# Patient Record
Sex: Female | Born: 1955 | Race: White | Hispanic: No | Marital: Married | Smoking: Never smoker
Health system: Southern US, Community
[De-identification: ages and names within clinical notes are randomized; demographics above are authoritative.]

## PROBLEM LIST (undated history)

## (undated) DIAGNOSIS — R112 Nausea with vomiting, unspecified: Secondary | ICD-10-CM

## (undated) DIAGNOSIS — E042 Nontoxic multinodular goiter: Secondary | ICD-10-CM

## (undated) DIAGNOSIS — R42 Dizziness and giddiness: Secondary | ICD-10-CM

## (undated) DIAGNOSIS — T8859XA Other complications of anesthesia, initial encounter: Secondary | ICD-10-CM

## (undated) DIAGNOSIS — C801 Malignant (primary) neoplasm, unspecified: Secondary | ICD-10-CM

## (undated) DIAGNOSIS — Z8739 Personal history of other diseases of the musculoskeletal system and connective tissue: Secondary | ICD-10-CM

## (undated) DIAGNOSIS — F419 Anxiety disorder, unspecified: Secondary | ICD-10-CM

## (undated) DIAGNOSIS — Z9889 Other specified postprocedural states: Secondary | ICD-10-CM

## (undated) DIAGNOSIS — R232 Flushing: Secondary | ICD-10-CM

## (undated) DIAGNOSIS — M1990 Unspecified osteoarthritis, unspecified site: Secondary | ICD-10-CM

## (undated) DIAGNOSIS — R0683 Snoring: Secondary | ICD-10-CM

## (undated) HISTORY — DX: Personal history of other diseases of the musculoskeletal system and connective tissue: Z87.39

## (undated) HISTORY — PX: OTHER SURGICAL HISTORY: SHX169

## (undated) HISTORY — DX: Flushing: R23.2

## (undated) HISTORY — DX: Anxiety disorder, unspecified: F41.9

## (undated) HISTORY — PX: PLACEMENT OF BREAST IMPLANTS: SHX6334

## (undated) HISTORY — DX: Unspecified osteoarthritis, unspecified site: M19.90

## (undated) HISTORY — DX: Dizziness and giddiness: R42

## (undated) HISTORY — PX: LUMBAR MICRODISCECTOMY: SHX99

## (undated) NOTE — Progress Notes (Signed)
 Formatting of this note is different from the original. Images from the original note were not included.  [facility] PCP: Dr. Geralene Annual exam Date: 08/21/2022  CHIEF COMPLAINT Chief Complaint  Patient presents with  ? Comprehensive Exam    CE part 2   SUBJECTIVE The patient is a 49 y.o. female presents with a history significant for history breast cancer presenting for annual exam.  The visit will be addressed in the following problem based manner:  Concerns:   - itchiness crease in her right leg; neosporin and facial ointments   -- 2 weeks of lotrimin and improved   -- would like me to evaluate - Calcium and Allegra taken off medication list initially - dermatology useless  Breast Cancer:  The patient has been cancer free since 2018, and continues on Arimidex  until 03/2023.    Preventative Medicine: - Immunizations:Reviwed with patient - Colon Cancer Screening: Reviewed with patient  - Female Health: Reviewed with patient - Screening Labs: Reviewed with patient   ASSESSMENT/PLAN The patient is a 21 y.o. female presents with a history significant for history of breast cancer presenting for annual exam.  Dermatitis: high on ddx intermittent yeast.  Patient to use prn Lotrimin OTC, then with cornstarch after resolves.   Breast Cancer: Oncologist with last plan of care:  - Mammogram due 8/29//2024 or after - DEXA 06/02/2021 T score 0.0, due in 3 years  - Discussed appropriate calcium intake with education in AVS  Colon Cancer Screening: 09/09/2021 (repeat in 5 years) Health Maintenance  Topic Date Due  ? Colorectal Cancer Screening  09/10/2026   IFG: Diet and exercise Lab Results  Component Value Date   HA1C 5.8 (H) 08/14/2022   Health Maintenance:  Health Maintenance Due  Topic Date Due  ? HEPATITIS C SCREENING  Never done   - Immunizations:  Immunization History  Administered Date(s) Administered  ? INFLUENZA, INJ, QUAD  01/19/2019  ? Influenza, High-Dose, Quad, 0.55mL, PF 01/16/2022  ? Influenza, Seasonal 01/23/2021  ? MODERNA COVID-19 MONOVALENT 12+YR (OMICRON XBB.1.5) 01/16/2022  ? Moderna Bivalent SARS-CoV-2 Vaccine 6+ Yrs 01/23/2021  ? Moderna Monovalent SARS-CoV-2 Vaccine 05/01/2019, 05/29/2019, 02/26/2020, 09/13/2020  ? Pneumococcal Conjugate Pcv20, Polysaccharide [id number] Conjugate, adjuvant, PF 08/19/2021  ? Tdap 09/21/2011, 09/12/2021  ? ZOSTER, SUBUNIT [facility]) 05/06/2018, 07/18/2018    -- RSV: Encouraged at the pharmacy   - Female health/Post-Meno: Patient has had a total hysterectomy (01/2020) Health Maintenance  Topic Date Due  ? MAMMOGRAM  12/16/2022   Health Maintenance  Topic Date Due  ? DEXA SCAN  06/02/2036   -- Bone and Breast Health and please see above  - Screening Labs: As above, in addition   Body mass index is 29.83 kg/m.  Lab Results  Component Value Date   WBC 6.11 08/14/2022   HGB 13.5 08/14/2022   HCT 41.2 08/14/2022   MCV 89.4 08/14/2022   PLT 269.00 08/14/2022   Lab Results  Component Value Date   NA 143 08/14/2022   K 4.5 08/14/2022   CL 106 10/26/2019   CO2 28.5 10/26/2019   AGAP 6 10/26/2019   CREATININE 0.7 08/14/2022   GFR >=60 10/26/2019   BUN 17 08/14/2022   GLUC 101 (H) 05/09/2021   CALCIUM 9.7 08/14/2022   ALB 3.9 08/14/2022   TP 7.1 08/14/2022   TBIL 0.5 08/14/2022   ALT 18 08/14/2022   AST 18.2 08/14/2022   ALK 69 08/14/2022   Lab Results  Component Value Date  CHOL 193 08/14/2022   Lab Results  Component Value Date   HDL 57 08/14/2022   Lab Results  Component Value Date   LDLCALC 113 08/14/2022   Lab Results  Component Value Date   TRIG 119 08/14/2022   The 10-year ASCVD risk score (Arnett DK, et al., 2019) is: 6.2%   Values used to calculate the score:     Age: 76 years     Sex: Female     Is Non-Hispanic African American: No     Diabetic: No     Tobacco smoker: No     Systolic Blood Pressure: 130 mmHg     Is BP  treated: No     HDL Cholesterol: 57 mg/dl     Total Cholesterol: 193 mg/dL  FOLLOW UP Return in about 1 year (around 08/21/2023) for CPE Non-fasting (30 min), Fasting Labs week prior.  VISIT ORDERS No orders of the defined types were placed in this encounter.  No orders of the defined types were placed in this encounter.  I have reviewed the past medical history, social history, family history in the medical record.   ROS A complete review of systems was negative, except for those noted above.  MEDICATIONS Current Outpatient Medications  Medication Sig Dispense Refill  ? anastrozole  (ARIMIDEX ) 1 mg tablet Take 1 mg by mouth daily.    ? azelaic acid 15 % gel Apply topically 2 (two) times daily. After skin is thoroughly washed and patted dry, gently but thoroughly massage a thin film of azelaic acid cream into the affected area twice daily, in the morning and evening.    ? carboxymethylcellulose (REFRESH PLUS) 0.5 % Drpprtte 1 drop 2 (two) times daily as needed (dry eyes).    ? triamcinolone (NASACORT) 55 mcg nasal inhaler 2 sprays by Nasal route daily.     No current facility-administered medications for this visit.   PHYSICAL EXAM:   Vital Signs: BP 130/80   Pulse 78   Resp 16   Ht 5' 9 (1.753 m)   Wt 202 lb (91.6 kg)   LMP  (LMP Unknown)   SpO2 98%   BMI 29.83 kg/m   Physical Exam  Constitutional:  Sitting comfortably, no respiratory distress, well groomed HENT:  Atraumatic - bilateral TMs WNL Eyes:   Conjunctiva normal Respiratory:  Normal breath sounds Cardiovascular:  Normal heart rate, Normal rhythm, No murmurs; No LE edema GI: NT/ND +BS, no HSM Musculoskeletal: Ambulates without assistance Skin:  Warm, Dry  Neurologic:  Alert & oriented x 3, Normal motor function,Grossly intact, No focal deficits noted.  Psychiatric:  Affect normal, Judgment normal, Mood normal.   Electronically signed by:  Dollie Snide, MD, 08/21/2022, 17:44   Electronically signed by  Dollie Snide, MD at 08/21/2022  5:45 PM EDT

---

## 2003-11-26 ENCOUNTER — Other Ambulatory Visit: Admission: RE | Admit: 2003-11-26 | Discharge: 2003-11-26 | Payer: Self-pay | Admitting: Obstetrics and Gynecology

## 2005-01-15 ENCOUNTER — Other Ambulatory Visit: Admission: RE | Admit: 2005-01-15 | Discharge: 2005-01-15 | Payer: Self-pay | Admitting: Obstetrics and Gynecology

## 2006-01-18 ENCOUNTER — Other Ambulatory Visit: Admission: RE | Admit: 2006-01-18 | Discharge: 2006-01-18 | Payer: Self-pay | Admitting: Obstetrics and Gynecology

## 2006-04-05 ENCOUNTER — Encounter: Admission: RE | Admit: 2006-04-05 | Discharge: 2006-04-05 | Payer: Self-pay | Admitting: Sports Medicine

## 2006-09-01 ENCOUNTER — Encounter: Admission: RE | Admit: 2006-09-01 | Discharge: 2006-09-01 | Payer: Self-pay | Admitting: Sports Medicine

## 2006-09-03 ENCOUNTER — Encounter: Admission: RE | Admit: 2006-09-03 | Discharge: 2006-09-03 | Payer: Self-pay | Admitting: Neurological Surgery

## 2006-10-04 ENCOUNTER — Ambulatory Visit (HOSPITAL_COMMUNITY): Admission: RE | Admit: 2006-10-04 | Discharge: 2006-10-04 | Payer: Self-pay | Admitting: Neurological Surgery

## 2007-01-20 ENCOUNTER — Other Ambulatory Visit: Admission: RE | Admit: 2007-01-20 | Discharge: 2007-01-20 | Payer: Self-pay | Admitting: Obstetrics and Gynecology

## 2008-01-24 ENCOUNTER — Other Ambulatory Visit: Admission: RE | Admit: 2008-01-24 | Discharge: 2008-01-24 | Payer: Self-pay | Admitting: Obstetrics and Gynecology

## 2008-05-07 ENCOUNTER — Encounter: Admission: RE | Admit: 2008-05-07 | Discharge: 2008-05-07 | Payer: Self-pay | Admitting: Internal Medicine

## 2008-08-03 ENCOUNTER — Encounter: Admission: RE | Admit: 2008-08-03 | Discharge: 2008-08-03 | Payer: Self-pay | Admitting: Gastroenterology

## 2009-02-15 ENCOUNTER — Other Ambulatory Visit: Admission: RE | Admit: 2009-02-15 | Discharge: 2009-02-15 | Payer: Self-pay | Admitting: Obstetrics and Gynecology

## 2009-05-01 ENCOUNTER — Encounter: Admission: RE | Admit: 2009-05-01 | Discharge: 2009-05-01 | Payer: Self-pay | Admitting: Internal Medicine

## 2010-02-24 ENCOUNTER — Other Ambulatory Visit: Admission: RE | Admit: 2010-02-24 | Discharge: 2010-02-24 | Payer: Self-pay | Admitting: Obstetrics and Gynecology

## 2010-09-02 NOTE — Op Note (Signed)
NAMEMarland Kitchen  AKIMA, SLAUGH              ACCOUNT NO.:  [id number]   MEDICAL RECORD NO.:  [id number]          PATIENT TYPE:  OIB   LOCATION:  3006                         FACILITY:  [facility]   PHYSICIAN:  Stefani Dama, M.D.  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   PROCEDURE:  Microdiskectomy L4-L5 right with operating microscope  microdissection technique.   SURGEON:  Stefani Dama, M.D.   FIRST ASSISTANT:  Hilda Lias, M.D.   ANESTHESIA:  General endotracheal.   INDICATIONS:  Christina Rivera is a 55 year old individual who has had  significant back and right lower extremity pain.  She has had several  episodes of this; and was found to have previously, a herniated nucleus  pulposus.  Currently, she has had this process ongoing for over a 6  months' period of time.  Despite efforts at conservative management, she  still had substantial right lumbar radicular pain and weakness in the  tibialis anterior group.  She has been advised regarding surgical  decompression.   DESCRIPTION OF PROCEDURE:  The patient was brought to the operating  room, supine on the stretcher.  After a smooth induction of general  endotracheal anesthesia, she was turned prone.  The back was prepped  with alcohol and DuraPrep and draped in a sterile fashion.  A midline  incision was made over the L4-L5 space which was localized with a  needle.  The dissection was taken down on the right side.  The  subcutaneous tissues and the fascia was infiltrated with a total of 20  mL of Marcaine 1/2% and 10 mL of a mixture of 1/2% Marcaine mixed 50:50  with 100,000 lidocaine and epinephrine.  The interlaminar space at L4-L5  was  identified and self-retaining McCullough retractor was placed in the  wound.  A laminotomy was then created removing the inferior margin  lamina of L4 off the mesial wall of the facette; and then by opening the  yellow ligament, a second localizing radiograph identified directly in  the disk space of L4-L5.  Dissection here revealed that there was a  substantial mass that was blanching the takeoff of the L5 nerve root.  This could be reflected more medially; and underlying this was a large  fragment of disk material.  This was removed in a singular piece.  Several of the smaller fragments of this were also found in the free  space.  The disk space was then explored as there was an opening from  the disk herniation, itself.   There was some ligament that appeared to be thickened as though it had  previously ruptured and repaired itself.  This was opened and an opening  was created into the disk space so as to allow performance.  This  completed diskectomy as possible using a combination of curettes,  rongeurs, and surgical dynamic dissectors,  both medially and laterally  within the disk space itself.  As the disk space was evacuated, care was  taken to make sure that the common dural tube and the nerve root itself  was protected well.  Once the disk space evacuation was completed, the  space was irrigated copiously with antibiotic irrigating solution.  Both  medially and laterally.  A large nerve hook was passed into these voids;  and no other fragments of disk could be retrieved.  With this 4 mg of  Depo-Medrol along with 50 mcg of fentanyl was left in the epidural  space.  The retractor was removed.  The lumbodorsal fascia  was closed with #1 Vicryl in an interrupted fashion; 2-0 Vicryl was used  in subcutaneous and subcuticular tissues; 3-0 Vicryl was used to close  the final subcuticular skin layer.  A dry sterile dressing was applied  to the skin.  The patient tolerated the procedure  well and was returned  to the recovery room in stable condition.      Stefani Dama, M.D.  Electronically Signed     HJE/MEDQ  D:  10/04/2006  T:  10/04/2006  Job:  [id number]

## 2011-02-05 LAB — BASIC METABOLIC PANEL
Calcium: 9.4
Chloride: 102
Creatinine, Ser: 0.67
GFR calc Af Amer: >60
Glucose, Bld: 88
Potassium: 4.5
Sodium: 134 — ABNORMAL LOW

## 2011-02-05 LAB — CBC
Hemoglobin: 14.1
Platelets: 360

## 2012-07-25 ENCOUNTER — Other Ambulatory Visit: Payer: Self-pay | Admitting: Internal Medicine

## 2012-07-25 DIAGNOSIS — M542 Cervicalgia: Secondary | ICD-10-CM

## 2012-07-31 ENCOUNTER — Ambulatory Visit
Admission: RE | Admit: 2012-07-31 | Discharge: 2012-07-31 | Disposition: A | Discharge status: Home / Self Care | Payer: PRIVATE HEALTH INSURANCE | Source: Ambulatory Visit | Attending: Internal Medicine | Admitting: Internal Medicine

## 2012-07-31 DIAGNOSIS — M542 Cervicalgia: Secondary | ICD-10-CM

## 2014-12-04 ENCOUNTER — Other Ambulatory Visit: Payer: Self-pay | Admitting: Internal Medicine

## 2014-12-04 DIAGNOSIS — E049 Nontoxic goiter, unspecified: Secondary | ICD-10-CM

## 2014-12-12 ENCOUNTER — Other Ambulatory Visit: Payer: PRIVATE HEALTH INSURANCE

## 2014-12-12 ENCOUNTER — Ambulatory Visit
Admission: RE | Admit: 2014-12-12 | Discharge: 2014-12-12 | Disposition: A | Discharge status: Home / Self Care | Payer: 59 | Source: Ambulatory Visit | Attending: Internal Medicine | Admitting: Internal Medicine

## 2014-12-12 DIAGNOSIS — E049 Nontoxic goiter, unspecified: Secondary | ICD-10-CM

## 2015-03-28 ENCOUNTER — Ambulatory Visit (INDEPENDENT_AMBULATORY_CARE_PROVIDER_SITE_OTHER): Payer: 59

## 2015-03-28 ENCOUNTER — Ambulatory Visit (INDEPENDENT_AMBULATORY_CARE_PROVIDER_SITE_OTHER): Payer: 59 | Admitting: Podiatry

## 2015-03-28 VITALS — BP 128/82 | HR 78 | Resp 16 | Ht 70.0 in | Wt 198.0 lb

## 2015-03-28 DIAGNOSIS — M2040 Other hammer toe(s) (acquired), unspecified foot: Secondary | ICD-10-CM | POA: Diagnosis not present

## 2015-03-28 NOTE — Progress Notes (Signed)
   Subjective:    Patient ID: Christina Rivera, female    DOB: 03-28-1956, 59 y.o.   MRN: [id number]  HPI Patient presents with bilateral foot pain, 2nd toes, hammertoe. Pt stated, "if walk for long periods of times, toes 2-4 feel like they are coming up toward foot". This has been going on for the past 3 years.   Review of Systems  Musculoskeletal: Positive for back pain.  Allergic/Immunologic: Positive for environmental allergies.  All other systems reviewed and are negative.      Objective:   Physical Exam        Assessment & Plan:

## 2015-03-29 ENCOUNTER — Telehealth: Payer: Self-pay | Admitting: *Deleted

## 2015-03-29 NOTE — Telephone Encounter (Signed)
"  I'm scheduled for surgery on January 10th.  I just tried to call the surgical center for an estimate.  They said they need a code before they can give me an estimate."  What surgery are you having I am having a hammer toe procedure on two toes."  That code is [phone].  "Okay, thank you."

## 2015-03-31 NOTE — Progress Notes (Signed)
Subjective:     Patient ID: Christina Rivera, female   DOB: 08/03/1955, 59 y.o.   MRN: [id number]  HPI patient presents with rigid hammertoe deformity second toe bilateral that has worsened over the last 3 years and is increasingly hard to wear shoe gear with. Patient states she's tried padding she's tried soaking and shoe gear modifications without relief   Review of Systems  All other systems reviewed and are negative.      Objective:   Physical Exam  Constitutional: She is oriented to person, place, and time.  Cardiovascular: Intact distal pulses.   Musculoskeletal: Normal range of motion.  Neurological: She is oriented to person, place, and time.  Skin: Skin is warm.  Nursing note and vitals reviewed.  neurovascular status intact muscle strength adequate range of motion within normal limits with patient found to have rigid contracture digit 2 bilateral with irritation at the head of the proximal phalanx and redness when palpated. Patient is found to have good digital perfusion and is well oriented 3     Assessment:     Hammertoe deformity with rigid contracture digit 2 bilateral with significant deformity of the proximal interphalangeal joint    Plan:     H&P and x-rays reviewed with patient. Due to the increased symptoms over the last 3 years and the fact the other digits are starting to elevate and the fact the hallux is starting to deviate I recommended digital stabilization procedures. We will do this with screw fixation and I advised her of this and she will reappoint in January and is tenably scheduled for the middle January for surgery  X-ray report indicate severe digital contracture at the second digit proximal interphalangeal joint

## 2015-04-18 ENCOUNTER — Ambulatory Visit (INDEPENDENT_AMBULATORY_CARE_PROVIDER_SITE_OTHER): Payer: 59 | Admitting: Podiatry

## 2015-04-18 ENCOUNTER — Encounter: Payer: Self-pay | Admitting: Podiatry

## 2015-04-18 VITALS — BP 121/68 | HR 86 | Resp 12

## 2015-04-18 DIAGNOSIS — M2040 Other hammer toe(s) (acquired), unspecified foot: Secondary | ICD-10-CM | POA: Diagnosis not present

## 2015-04-18 NOTE — Progress Notes (Signed)
Subjective:     Patient ID: Christina Rivera, female   DOB: [birth year], 59 y.o.   MRN: [id number]  HPI patient presents for correction of chronic hammertoe deformity second digit bilateral stating they become very painful and make it hard to wear shoe gear comfortably   Review of Systems     Objective:   Physical Exam Neurovascular status intact with rigid contracture digit 2 bilateral with redness and pain on top of the toes    Assessment:     Chronic hammertoe deformity second digit bilateral    Plan:     Reviewed condition and allow patient to read consent form reviewing alternative treatments complications for digital fusion digits 2 left with either screws or pins depending on what fits best. Patient wants surgery and signs consent form after extensive review and is scheduled for outpatient surgery in the next several weeks and is encouraged to call with questions prior to procedure  X-rays reviewed indicated significant rigid contracture digit 2 bilateral

## 2015-04-18 NOTE — Patient Instructions (Signed)
Pre-Operative Instructions  Congratulations, you have decided to take an important step to improving your quality of life.  You can be assured that the doctors of [facility] will be with you every step of the way.  1. Plan to be at the surgery center/hospital at least 1 (one) hour prior to your scheduled time unless otherwise directed by the surgical center/hospital staff.  You must have a responsible adult accompany you, remain during the surgery and drive you home.  Make sure you have directions to the surgical center/hospital and know how to get there on time. 2. For hospital based surgery you will need to obtain a history and physical form from your family physician within 1 month prior to the date of surgery- we will give you a form for you primary physician.  3. We make every effort to accommodate the date you request for surgery.  There are however, times where surgery dates or times have to be moved.  We will contact you as soon as possible if a change in schedule is required.   4. No Aspirin/Ibuprofen for one week before surgery.  If you are on aspirin, any non-steroidal anti-inflammatory medications (Mobic, Aleve, Ibuprofen) you should stop taking it 7 days prior to your surgery.  You make take Tylenol  For pain prior to surgery.  5. Medications- If you are taking daily heart and blood pressure medications, seizure, reflux, allergy, asthma, anxiety, pain or diabetes medications, make sure the surgery center/hospital is aware before the day of surgery so they may notify you which medications to take or avoid the day of surgery. 6. No food or drink after midnight the night before surgery unless directed otherwise by surgical center/hospital staff. 7. No alcoholic beverages 24 hours prior to surgery.  No smoking 24 hours prior to or 24 hours after surgery. 8. Wear loose pants or shorts- loose enough to fit over bandages, boots, and casts. 9. No slip on shoes, sneakers are best. 10. Bring  your boot with you to the surgery center/hospital.  Also bring crutches or a walker if your physician has prescribed it for you.  If you do not have this equipment, it will be provided for you after surgery. 11. If you have not been contracted by the surgery center/hospital by the day before your surgery, call to confirm the date and time of your surgery. 12. Leave-time from work may vary depending on the type of surgery you have.  Appropriate arrangements should be made prior to surgery with your employer. 13. Prescriptions will be provided immediately following surgery by your doctor.  Have these filled as soon as possible after surgery and take the medication as directed. 14. Remove nail polish on the operative foot. 15. Wash the night before surgery.  The night before surgery wash the foot and leg well with the antibacterial soap provided and water paying special attention to beneath the toenails and in between the toes.  Rinse thoroughly with water and dry well with a towel.  Perform this wash unless told not to do so by your physician.  Enclosed: 1 Ice pack (please put in freezer the night before surgery)   1 Hibiclens skin cleaner   Pre-op Instructions  If you have any questions regarding the instructions, do not hesitate to call our office.  [place]: [address] [place], [place] [address] [phone]  [place]: [address] [place], [place] [address] [phone]  [place]: [address]  [place], [place] [address] [phone]  Dr. Richard   Tuchman DPM, Dr. Norman Regal DPM Dr. Richard Sikora DPM, Dr. M. Todd Hyatt DPM, Dr. Kathryn Egerton DPM 

## 2015-04-24 ENCOUNTER — Telehealth: Payer: Self-pay | Admitting: *Deleted

## 2015-04-24 NOTE — Telephone Encounter (Signed)
I'm returning your call.  "Yes, I am working on her case.  I need to get clinicals sent to [place] for review."  I send it over, what's your fax number?"  You can send it directly to my desk, my fax number is [phone]."  I faxed clinicals to [facility] at [place].

## 2015-04-24 NOTE — Telephone Encounter (Signed)
"  Please give me a call regarding a patient."

## 2015-04-25 ENCOUNTER — Telehealth: Payer: Self-pay | Admitting: *Deleted

## 2015-04-25 NOTE — Telephone Encounter (Signed)
Authorization was obtained from [employer] [facility] [employer] for surgery scheduled for 04/30/2015 for Hammer Toe Repair 2nd bilateral foot.  Authorization number is [id number].  Authorization number was sent to [facility] at [facility].

## 2015-04-29 ENCOUNTER — Telehealth: Payer: Self-pay | Admitting: *Deleted

## 2015-04-29 NOTE — Telephone Encounter (Signed)
"  I'm scheduled for surgery on tomorrow.  I started taking a Z-pak on Friday for a sinus infection.  I take my last pill tomorrow.  Is it okay to take it before I go for surgery?"  You can call the surgical center and ask them.  It's probably okay to just take it after surgery.  "I need it to be noted in my chart that I'm allergic to Doxycycline.  Is there anything else I need to do?"  No, just remember not to eat or drink anything after midnight.  "Okay, I'll see him bright and early at 8:30am."

## 2015-04-30 ENCOUNTER — Encounter: Payer: Self-pay | Admitting: Podiatry

## 2015-04-30 DIAGNOSIS — M2042 Other hammer toe(s) (acquired), left foot: Secondary | ICD-10-CM | POA: Diagnosis not present

## 2015-04-30 DIAGNOSIS — M2041 Other hammer toe(s) (acquired), right foot: Secondary | ICD-10-CM | POA: Diagnosis not present

## 2015-05-02 ENCOUNTER — Telehealth: Payer: Self-pay | Admitting: *Deleted

## 2015-05-02 NOTE — Telephone Encounter (Signed)
Pt states had surgery 04/30/2015 for hammer toes on both feet with Dr. Paulla Dolly.  Pt states the toes began to feel strange yesterday and she peeked and the gauze between the toes is saturated.  I asked pt if the gauze between her toes was dry or moist and pt states dry.  I informed pt the dried blood in the gauze would serve as a splint and protect the toes, and was not an unusual occurrence.  Pt states she feels relieved and ask appt time, I informed pt her 1st POV 05/09/2015 at 1100am.

## 2015-05-08 ENCOUNTER — Ambulatory Visit (INDEPENDENT_AMBULATORY_CARE_PROVIDER_SITE_OTHER): Payer: 59

## 2015-05-08 ENCOUNTER — Encounter: Payer: Self-pay | Admitting: Podiatry

## 2015-05-08 ENCOUNTER — Ambulatory Visit (INDEPENDENT_AMBULATORY_CARE_PROVIDER_SITE_OTHER): Payer: 59 | Admitting: Podiatry

## 2015-05-08 VITALS — BP 141/84 | HR 74 | Resp 16

## 2015-05-08 DIAGNOSIS — Z9889 Other specified postprocedural states: Secondary | ICD-10-CM

## 2015-05-08 DIAGNOSIS — M2040 Other hammer toe(s) (acquired), unspecified foot: Secondary | ICD-10-CM | POA: Diagnosis not present

## 2015-05-08 NOTE — Progress Notes (Signed)
Subjective:     Patient ID: Christina Rivera, female   DOB: Feb 10, 1956, 60 y.o.   MRN: [id number]  HPI patient states I'm doing very well with my second toes   Review of Systems     Objective:   Physical Exam Neurovascular status intact with pins and alignment second digit bilateral and wound edges coapted well with negative Homans sign    Assessment:     Doing well post fusion digit 2 bilateral    Plan:     Reviewed x-ray reapplied sterile dressing and reappoint 2 weeks for stitch removal or earlier if needed

## 2015-05-09 ENCOUNTER — Other Ambulatory Visit: Payer: Self-pay

## 2015-05-10 ENCOUNTER — Other Ambulatory Visit: Payer: Self-pay

## 2015-05-13 ENCOUNTER — Telehealth: Payer: Self-pay | Admitting: *Deleted

## 2015-05-13 NOTE — Telephone Encounter (Signed)
Pt states one of the pins in her toe looks like is about 4x more out than it was.  I told pt to cover toe with a sock that would not allow the pin to catch on anything, but not to push it in, and transferred her to schedulers to get in tomorrow to see Dr. Milinda Pointer.

## 2015-05-14 ENCOUNTER — Encounter: Payer: Self-pay | Admitting: Podiatry

## 2015-05-14 ENCOUNTER — Ambulatory Visit (INDEPENDENT_AMBULATORY_CARE_PROVIDER_SITE_OTHER): Payer: 59 | Admitting: Podiatry

## 2015-05-14 VITALS — BP 127/63 | HR 90 | Resp 16

## 2015-05-14 DIAGNOSIS — Z9889 Other specified postprocedural states: Secondary | ICD-10-CM

## 2015-05-14 DIAGNOSIS — M2040 Other hammer toe(s) (acquired), unspecified foot: Secondary | ICD-10-CM

## 2015-05-14 NOTE — Progress Notes (Signed)
She presents today 1 week after having seen Dr. Paulla Dolly for her first postop visit. She states that I think it may have been up on the toe too much because the patient is starting to back out. She states that she has been washing the foot regularly. She states that she has been dressing the foot regularly and holding the toe down with the dressing.  Objective: Vital signs are stable alert and oriented 3. Once I removed the dressing pulses are palpable left foot. Hammertoe repair second digit left foot appears to be healing very nicely with extrusion of pin from the second toe. I evaluated previous radiographs to confirm that the pin is in good alignment and how far it has actually backed out. It appears to have only backed out approximately 1 cm and is still crossing the arthrodesis site.  Assessment: At this point there was not enough room to bend the pin and cut it and it is too early to remove the pin for the sake of the arthrodesis.  Plan: I dressed the toe with Coban and instructed her not to get this wet and she will follow-up with primary surgeon in the near future for pin removal.

## 2015-05-24 ENCOUNTER — Ambulatory Visit (INDEPENDENT_AMBULATORY_CARE_PROVIDER_SITE_OTHER): Payer: 59 | Admitting: Podiatry

## 2015-05-24 DIAGNOSIS — M2040 Other hammer toe(s) (acquired), unspecified foot: Secondary | ICD-10-CM

## 2015-05-24 DIAGNOSIS — Z9889 Other specified postprocedural states: Secondary | ICD-10-CM

## 2015-05-26 NOTE — Progress Notes (Signed)
Patient ID: Christina Rivera, female   DOB: Sep 01, 1955, 60 y.o.   MRN: [id number]  Subjective: Christina Rivera is a 60 y.o. is seen today in office s/p bilateral 2nd digit hammertoe repair by Dr. Paulla Dolly. She presents today for suture removal. They state their pain is minimal. She has continued with his surgical shoes. She has not been getting the area wet. Denies any systemic complaints such as fevers, chills, nausea, vomiting. No calf pain, chest pain, shortness of breath.   Objective: General: No acute distress, AAOx3  DP/PT pulses palpable 2/4, CRT < 3 sec to all digits.  Protective sensation intact. Motor function intact.  Bilateral feet: Incisions are well coapted without any evidence of dehiscence and sutures intact. There is no surrounding erythema, ascending cellulitis, fluctuance, crepitus, malodor, drainage/purulence. There is mild edema around the surgical site. There is no signficant pain along the surgical site.  No other areas of tenderness to bilateral lower extremities.  No other open lesions or pre-ulcerative lesions.  No pain with calf compression, swelling, warmth, erythema.   Assessment and Plan:  Status post bilateral 2nd hammertoe repair, doing well with no complications   -Treatment options discussed including all alternatives, risks, and complications -Inches removed today without complications. She has her to wash her foot however I would not get the pin sites wet. -Continue surgical shoe. -Ice/elevation -Pain medication as needed. -Monitor for any clinical signs or symptoms of infection and DVT/PE and directed to call the office immediately should any occur or go to the ER. -Follow-up in 2 weeks with Dr. Paulla Dolly or sooner if any problems arise. In the meantime, encouraged to call the office with any questions, concerns, change in symptoms.   Celesta Gentile, DPM

## 2015-06-06 ENCOUNTER — Ambulatory Visit: Payer: Self-pay

## 2015-06-06 ENCOUNTER — Encounter: Payer: Self-pay | Admitting: Podiatry

## 2015-06-06 ENCOUNTER — Ambulatory Visit (INDEPENDENT_AMBULATORY_CARE_PROVIDER_SITE_OTHER): Payer: 59 | Admitting: Podiatry

## 2015-06-06 DIAGNOSIS — Z9889 Other specified postprocedural states: Secondary | ICD-10-CM | POA: Diagnosis not present

## 2015-06-06 DIAGNOSIS — M2040 Other hammer toe(s) (acquired), unspecified foot: Secondary | ICD-10-CM

## 2015-06-07 ENCOUNTER — Other Ambulatory Visit: Payer: 59

## 2015-06-07 NOTE — Progress Notes (Signed)
Subjective:     Patient ID: Christina Rivera, female   DOB: 04/29/1955, 60 y.o.   MRN: [id number]  HPI patient presents stating my toes are doing well and the pins are in position   Review of Systems     Objective:   Physical Exam Neurovascular status intact with digits and good alignment second digit bilateral with toes in good alignment with mild elevation of both toes left over right    Assessment:     Doing well post digital fusion digit 2 bilateral    Plan:     Reviewed x-rays remove pins and applied sterile dressings. Dispensed above ankle braces with Velcro strapping to plantarflexed the second toes to maintain position and not allow for pathology to occur. Reappoint 4 weeks or earlier if needed  X-ray report indicated pins were in good position toes are adequately fixated with good alignment noted

## 2015-07-04 ENCOUNTER — Encounter: Payer: Self-pay | Admitting: Podiatry

## 2015-07-04 ENCOUNTER — Ambulatory Visit (INDEPENDENT_AMBULATORY_CARE_PROVIDER_SITE_OTHER): Payer: 59

## 2015-07-04 ENCOUNTER — Ambulatory Visit (INDEPENDENT_AMBULATORY_CARE_PROVIDER_SITE_OTHER): Payer: 59 | Admitting: Podiatry

## 2015-07-04 VITALS — BP 122/71 | HR 73 | Resp 16

## 2015-07-04 DIAGNOSIS — M2040 Other hammer toe(s) (acquired), unspecified foot: Secondary | ICD-10-CM

## 2015-07-04 DIAGNOSIS — M779 Enthesopathy, unspecified: Secondary | ICD-10-CM

## 2015-07-04 DIAGNOSIS — Z9889 Other specified postprocedural states: Secondary | ICD-10-CM

## 2015-07-10 NOTE — Progress Notes (Signed)
Subjective:     Patient ID: Christina Rivera, female   DOB: 1955-07-31, 60 y.o.   MRN: [id number]  HPI patient states she's doing real well with her toes with minimal discomfort and swelling and able to wear shoe gear comfortably   Review of Systems     Objective:   Physical Exam Neurovascular status intact muscle strength adequate range of motion within normal limits with digits and good alignment wound edges well coapted and only mild edema in the proximal portion of the toes    Assessment:     Doing well post digital fusion bilateral    Plan:     X-rays reviewed and allow patient to return to normal activity with the continuation of plantar flexion of the digits. Patient will be seen back for [place] to recheck  X-ray report indicates that the toes are in good alignment with fusion at the interphalangeal joint that's taken place

## 2015-07-30 ENCOUNTER — Ambulatory Visit: Payer: 59 | Admitting: *Deleted

## 2015-07-30 DIAGNOSIS — M779 Enthesopathy, unspecified: Secondary | ICD-10-CM

## 2015-07-30 NOTE — Patient Instructions (Addendum)
tfWEARING INSTRUCTIONS FOR ORTHOTICS  Don't expect to be comfortable wearing your orthotic devices for the first time.  Like eyeglasses, you may be aware of them as time passes, they will not be uncomfortable and you will enjoy wearing them.  FOLLOW THESE INSTRUCTIONS EXACTLY!  1. Wear your orthotic devices for:       Not more than 1 hour the first day.       Not more than 2 hours the second day.       Not more than 3 hours the third day and so on.        Or wear them for as long as they feel comfortable.       If you experience discomfort in your feet or legs take them out.  When feet & legs feel       better, put them back in.  You do need to be consistent and wear them a little        everyday. 2.   If at any time the orthotic devices become acutely uncomfortable before the       time for that particular day, STOP WEARING THEM. 3.   On the next day, do not increase the wearing time. 4.   Subsequently, increase the wearing time by 15-30 minutes only if comfortable to do       so. 5.   You will be seen by your doctor about 2-4 weeks after you receive your orthotic       devices, at which time you will probably be wearing your devices comfortably        for about 8 hours or more a day. 6.   Some patients occasionally report mild aches or discomfort in other parts of the of       body such as the knees, hips or back after 3 or 4 consecutive hours of wear.  If this       is the case with you, do not extend your wearing time.  Instead, cut it back an hour or       two.  In all likelihood, these symptoms will disappear in a short period of time as your       body posture realigns itself and functions more efficiently. 7.   It is possible that your orthotic device may require some small changes or adjustment       to improve their function or make them more comfortable.   This is usually not done       before one to three months have elapsed.  These adjustments are made in        accordance  with the changed position your feet are assuming as a result of       improved biomechanical function. 8.   In women's shoes, it's not unusual for your heel to slip out of the shoe, particularly if       they are step-in-shoes.  If this is the case, try other shoes or other styles.  Try to       purchase shoes which have deeper heal seats or higher heel counters. 9.   Squeaking of orthotics devices in the shoes is due to the movement of the devices       when they are functioning normally.  To eliminate squeaking, simply dust some       baby powder into your shoes before inserting the devices.  If this does not work,  apply soap or wax to the edges of the orthotic devices or put a tissue into the shoes. 10. It is important that you follow these directions explicitly.  Failure to do so will simply       prolong the adjustment period or create problems which are easily avoided.  It makes       no difference if you are wearing your orthotic devices for only a few hours after        several months, so long as you are wearing them comfortably for those hours. 11. If you have any questions or complaints, contact our office.  We have no way of       knowing about your problems unless you tell us.  If we do not hear from you, we will       assume that you are proceeding well.  

## 2015-07-30 NOTE — Progress Notes (Signed)
Patient ID: Christina Rivera, female   DOB: Jan 06, 1956, 60 y.o.   MRN: [id number] Patient presents for orthotic pick up.  Verbal and written break in and wear instructions given.  Patient will follow up in 4 weeks if symptoms worsen or fail to improve.

## 2015-08-01 DIAGNOSIS — M779 Enthesopathy, unspecified: Secondary | ICD-10-CM

## 2015-11-13 ENCOUNTER — Other Ambulatory Visit: Payer: Self-pay | Admitting: Radiology

## 2015-11-15 ENCOUNTER — Telehealth: Payer: Self-pay | Admitting: *Deleted

## 2015-11-15 DIAGNOSIS — C50212 Malignant neoplasm of upper-inner quadrant of left female breast: Secondary | ICD-10-CM

## 2015-11-15 NOTE — Telephone Encounter (Signed)
Confirmed [facility] for 11/20/15 at 1215pm .  Instructions and contact information given.

## 2015-11-19 DIAGNOSIS — C801 Malignant (primary) neoplasm, unspecified: Secondary | ICD-10-CM

## 2015-11-19 HISTORY — DX: Malignant (primary) neoplasm, unspecified: C80.1

## 2015-11-20 ENCOUNTER — Other Ambulatory Visit: Payer: Self-pay | Admitting: General Surgery

## 2015-11-20 ENCOUNTER — Encounter: Payer: Self-pay | Admitting: Hematology and Oncology

## 2015-11-20 ENCOUNTER — Ambulatory Visit
Admission: RE | Admit: 2015-11-20 | Discharge: 2015-11-20 | Disposition: A | Discharge status: Home / Self Care | Payer: 59 | Source: Ambulatory Visit | Attending: Radiation Oncology | Admitting: Radiation Oncology

## 2015-11-20 ENCOUNTER — Ambulatory Visit (HOSPITAL_BASED_OUTPATIENT_CLINIC_OR_DEPARTMENT_OTHER): Payer: 59 | Admitting: Hematology and Oncology

## 2015-11-20 ENCOUNTER — Encounter: Payer: Self-pay | Admitting: Physical Therapy

## 2015-11-20 ENCOUNTER — Other Ambulatory Visit (HOSPITAL_BASED_OUTPATIENT_CLINIC_OR_DEPARTMENT_OTHER): Payer: 59

## 2015-11-20 ENCOUNTER — Ambulatory Visit: Payer: 59 | Attending: General Surgery | Admitting: Physical Therapy

## 2015-11-20 DIAGNOSIS — R293 Abnormal posture: Secondary | ICD-10-CM | POA: Diagnosis present

## 2015-11-20 DIAGNOSIS — C50212 Malignant neoplasm of upper-inner quadrant of left female breast: Secondary | ICD-10-CM

## 2015-11-20 DIAGNOSIS — C50312 Malignant neoplasm of lower-inner quadrant of left female breast: Secondary | ICD-10-CM

## 2015-11-20 DIAGNOSIS — Z170 Estrogen receptor positive status [ER+]: Secondary | ICD-10-CM

## 2015-11-20 LAB — COMPREHENSIVE METABOLIC PANEL
ALBUMIN: 4.2 g/dL (ref 3.5–5.0)
ALK PHOS: 55 U/L (ref 40–150)
ALT: 24 U/L (ref 0–55)
ANION GAP: 9 meq/L (ref 3–11)
AST: 18 U/L (ref 5–34)
BILIRUBIN TOTAL: 0.37 mg/dL (ref 0.20–1.20)
BUN: 17.3 mg/dL (ref 7.0–26.0)
CO2: 25 mEq/L (ref 22–29)
Calcium: 9.5 mg/dL (ref 8.4–10.4)
Chloride: 106 mEq/L (ref 98–109)
Creatinine: 0.8 mg/dL (ref 0.6–1.1)
EGFR: 76 mL/min/{1.73_m2} — AB (ref >90)
Glucose: 127 mg/dl (ref 70–140)
POTASSIUM: 3.8 meq/L (ref 3.5–5.1)
SODIUM: 141 meq/L (ref 136–145)
TOTAL PROTEIN: 7.7 g/dL (ref 6.4–8.3)

## 2015-11-20 LAB — CBC WITH DIFFERENTIAL/PLATELET
BASO%: 0.6 % (ref 0.0–2.0)
BASOS ABS: 0.1 10*3/uL (ref 0.0–0.1)
EOS ABS: 0 10*3/uL (ref 0.0–0.5)
EOS%: 0.3 % (ref 0.0–7.0)
HCT: 43.5 % (ref 34.8–46.6)
HEMOGLOBIN: 14.2 g/dL (ref 11.6–15.9)
LYMPH%: 29.5 % (ref 14.0–49.7)
MCH: 28.8 pg (ref 25.1–34.0)
MCHC: 32.7 g/dL (ref 31.5–36.0)
MCV: 88 fL (ref 79.5–101.0)
MONO#: 0.5 10*3/uL (ref 0.1–0.9)
MONO%: 5.8 % (ref 0.0–14.0)
NEUT%: 63.8 % (ref 38.4–76.8)
NEUTROS ABS: 5.8 10*3/uL (ref 1.5–6.5)
PLATELETS: 291 10*3/uL (ref 145–400)
RBC: 4.95 10*6/uL (ref 3.70–5.45)
RDW: 13.9 % (ref 11.2–14.5)
WBC: 9.1 10*3/uL (ref 3.9–10.3)
lymph#: 2.7 10*3/uL (ref 0.9–3.3)

## 2015-11-20 NOTE — Assessment & Plan Note (Signed)
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 After reviewing the pathology in detail, we proceeded to discuss the different treatment options between surgery, radiation, chemotherapy, antiestrogen therapies.  Recommendations: 1. Breast conserving surgery followed by 2. Oncotype DX testing to determine if chemotherapy would be of any benefit followed by 3. Adjuvant radiation therapy followed by 4. Adjuvant antiestrogen therapy  Oncotype counseling: I discussed Oncotype DX test. I explained to the patient that this is a 21 gene panel to evaluate patient tumors DNA to calculate recurrence score. This would help determine whether patient has high risk or intermediate risk or low risk breast cancer. She understands that if her tumor was found to be high risk, she would benefit from systemic chemotherapy. If low risk, no need of chemotherapy. If she was found to be intermediate risk, we would need to evaluate the score as well as other risk factors and determine if an abbreviated chemotherapy may be of benefit.  Return to clinic after surgery to discuss final pathology report and then determine if Oncotype DX testing will need to be sent.

## 2015-11-20 NOTE — Progress Notes (Signed)
[facility] CONSULT NOTE  Patient Care Team: Deland Pretty, MD as PCP - General (Internal Medicine) Stark Klein, MD as Consulting Physician (General Surgery) Nicholas Lose, MD as Consulting Physician (Hematology and Oncology) Nicholas Lose, MD as Consulting Physician (Hematology and Oncology)  CHIEF COMPLAINTS/PURPOSE OF CONSULTATION:  Newly diagnosed breast cancer  HISTORY OF PRESENTING ILLNESS:  Christina Rivera 60 y.o. female is here because of recent diagnosis of left breast cancer. Patient had routine screening mammogram that revealed a spiculated mass at 9:00 position measuring 8 mm in size. Axilla was negative. 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 The tumor was ER/PR positive HER-2 negative with a Ki-67 3%. She was presented this morning at the multidisciplinary tumor board and she is here today to discuss a treatment plan with the multidisciplinary clinic. She is accompanied by HER-2 daughters and her husband. One of her daughters is a Presenter, broadcasting and the other daughter is a Physicist, medical.   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   Her father was diagnosed with lung cancer and he died from it.   MEDICAL HISTORY:  Past Medical History:  Diagnosis Date  . Anxiety   . Arthritis   . Hot flashes   . Hx of  degenerative disc disease   . Vertigo     SURGICAL HISTORY: Past Surgical History:  Procedure Laterality Date  . bilateral hammertoe repair    . PLACEMENT OF BREAST IMPLANTS      SOCIAL HISTORY: Social History   Social History  . Marital status: Married    Spouse name: N/A  . Number of children: N/A  . Years of education: N/A   Occupational History  . Not on file.   Social History Main Topics  . Smoking status: Never Smoker  . Smokeless tobacco: Not on file  . Alcohol use 0.0 oz/week     Comment: 4-5/wk  . Drug use: No  . Sexual activity: Not on file   Other Topics Concern  . Not on file   Social History Narrative  . No narrative on file    FAMILY HISTORY: Family History  Problem Relation Age of Onset  . Lung cancer Father     ALLERGIES:  is allergic to tramadol; doxycycline; and neomycin.  MEDICATIONS:  Current Outpatient Prescriptions  Medication Sig Dispense Refill  . fexofenadine (ALLEGRA) 180 MG tablet Take 180 mg by mouth daily.    Marland Kitchen FINACEA 15 % cream     . medroxyPROGESTERone (PROVERA) 10 MG tablet     . Triamcinolone Acetonide (NASACORT ALLERGY 24HR NA) Place into the nose daily.     No current facility-administered medications for this visit.     REVIEW OF SYSTEMS:   Constitutional: Denies fevers, chills or abnormal night sweats Eyes:  Denies blurriness of vision, double vision or watery eyes Ears, nose, mouth, throat, and face: Denies mucositis or sore throat Respiratory: Denies cough, dyspnea or wheezes Cardiovascular: Denies palpitation, chest discomfort or lower extremity swelling Gastrointestinal:  Denies nausea, heartburn or change in bowel habits Skin: Denies abnormal skin rashes Lymphatics: Denies new lymphadenopathy or easy bruising Neurological:Denies numbness, tingling or new weaknesses Behavioral/Psych: Mood is stable, no new changes  Breast:  Denies any palpable lumps or discharge All other systems were reviewed with the  patient and are negative.  PHYSICAL EXAMINATION: ECOG PERFORMANCE STATUS: 0 - Asymptomatic  Vitals:   11/20/15 1250  BP: (!) 156/78  Pulse: 87  Resp: 18  Temp: 99 F (37.2 C)   Filed Weights   11/20/15 1250  Weight: 208 lb 12.8 oz (94.7 kg)    GENERAL:alert, no distress and comfortable SKIN: skin color, texture, turgor are normal, no rashes or significant lesions EYES: normal, conjunctiva are pink and non-injected, sclera clear OROPHARYNX:no exudate, no erythema and lips, buccal mucosa, and tongue normal  NECK: supple, thyroid normal size, non-tender, without nodularity LYMPH:  no palpable lymphadenopathy in the cervical, axillary or inguinal LUNGS: clear to auscultation and percussion with normal breathing effort HEART: regular rate & rhythm and no murmurs and no lower extremity edema ABDOMEN:abdomen soft, non-tender and normal bowel sounds Musculoskeletal:no cyanosis of digits and no clubbing  PSYCH: alert & oriented x 3 with fluent speech NEURO: no focal motor/sensory deficits BREAST: No palpable nodules in breast. No palpable axillary or supraclavicular lymphadenopathy (exam performed in the presence of a chaperone)   LABORATORY DATA:  I have reviewed the data as listed Lab Results  Component Value Date   WBC 9.1 11/20/2015   HGB 14.2 11/20/2015   HCT 43.5 11/20/2015   MCV 88.0 11/20/2015   PLT 291 11/20/2015   Lab Results  Component Value Date   NA 141 11/20/2015   K 3.8 11/20/2015   CL 102 09/29/2006   CO2 25 11/20/2015    RADIOGRAPHIC STUDIES: I have personally reviewed the radiological reports and agreed with the findings in the report.  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 After reviewing the pathology in detail, we proceeded to discuss the different treatment options between surgery, radiation, chemotherapy, antiestrogen therapies.  Recommendations: 1. Breast conserving surgery followed by 2. Oncotype DX testing to determine if chemotherapy would be of any benefit followed by 3. Adjuvant radiation therapy followed by 4. Adjuvant antiestrogen therapy  Oncotype counseling: I discussed Oncotype DX test. I explained to the patient that this is a 21 gene panel to evaluate patient tumors DNA to calculate recurrence score. This would help determine whether patient has high risk or intermediate risk or low risk breast cancer. She understands that if her tumor was found to be high risk, she would benefit from systemic chemotherapy. If low risk, no need of chemotherapy. If she was found to be intermediate risk, we would need to evaluate the score as well as other risk factors and determine if an  abbreviated chemotherapy may be of benefit.  Return to clinic after surgery to discuss final pathology report and then determine if Oncotype DX testing will need to be sent.  All questions were answered. The patient knows to call the clinic with any problems, questions or concerns.    Rulon Eisenmenger, MD 11/20/15

## 2015-11-20 NOTE — Patient Instructions (Signed)

## 2015-11-20 NOTE — Therapy (Signed)
[place], [place], [address] Phone: [phone]   Fax:  [phone]  Physical Therapy Evaluation  Patient Details  Name: Christina Rivera MRN: [id number] Date of Birth: 16-Sep-1955 Referring Provider: Dr. Stark Klein  Encounter Date: 11/20/2015      PT End of Session - 11/20/15 1607    Visit Number 1   Number of Visits 1   PT Start Time 1456   PT Stop Time 1520   PT Time Calculation (min) 24 min   Activity Tolerance Patient tolerated treatment well   Behavior During Therapy [facility] for tasks assessed/performed      Past Medical History:  Diagnosis Date  . Anxiety   . Arthritis   . Hot flashes   . Hx of degenerative disc disease   . Vertigo     Past Surgical History:  Procedure Laterality Date  . bilateral hammertoe repair    . PLACEMENT OF BREAST IMPLANTS      There were no vitals filed for this visit.       Subjective Assessment - 11/20/15 1607    Subjective Patient reports she is here today to be seen by her medical team for her newly diagnosed left breast cancer.   Patient is accompained by: Family member   Pertinent History Patient was diagnosed on 10/29/15 with left invasive ductal carcinoma breast cancer.  It is grade 1, ER/PR positive, HER2 negative and has a Ki67 of 3%.  It measures 8 mm and is located in the upper inner quadrant.   Patient Stated Goals Learn post op shoulder ROM HEP and prevent lymphedema            [facility] PT Assessment - 11/20/15 0001      Assessment   Medical Diagnosis Left breast cancer   Referring Provider Dr. Stark Klein   Onset Date/Surgical Date 10/29/15   Hand Dominance Right   Prior Therapy none     Precautions   Precautions Other (comment)   Precaution Comments Active cancer     Restrictions   Weight Bearing Restrictions No     Balance Screen   Has the patient fallen in the past 6 months Yes   How many times? 1   Has the patient had  a decrease in activity level because of a fear of falling?  No   Is the patient reluctant to leave their home because of a fear of falling?  No  Pt. slipped in her backyard; some balance deficits (vertigo)     Home Environment   Living Environment Private residence   Living Arrangements Spouse/significant other   Available Help at Discharge Family     Prior Function   Level of Independence Independent   Vocation Full time employment   English as a second language teacher; Social worker   Leisure She does not exercise     Cognition   Overall Cognitive Status Within Functional Limits for tasks assessed     Posture/Postural Control   Posture/Postural Control Postural limitations   Postural Limitations Rounded Shoulders;Forward head     ROM / Strength   AROM / PROM / Strength AROM;Strength     AROM   AROM Assessment Site Shoulder;Cervical   Right/Left Shoulder Left;Right   Right Shoulder Extension 46 Degrees   Right Shoulder Flexion 144 Degrees   Right Shoulder ABduction 152 Degrees   Right Shoulder Internal Rotation 78 Degrees   Right Shoulder External Rotation 80 Degrees   Left Shoulder Extension 48 Degrees  Left Shoulder Flexion 140 Degrees   Left Shoulder ABduction 147 Degrees   Left Shoulder Internal Rotation 74 Degrees   Left Shoulder External Rotation 73 Degrees   Cervical Flexion WNL   Cervical Extension WNL   Cervical - Right Side Bend WNL   Cervical - Left Side Bend WNL   Cervical - Right Rotation WNL   Cervical - Left Rotation WNL     Strength   Overall Strength Within functional limits for tasks performed           LYMPHEDEMA/ONCOLOGY QUESTIONNAIRE - 11/20/15 1605      Type   Cancer Type Left breast cancer     Lymphedema Assessments   Lymphedema Assessments Upper extremities     Right Upper Extremity Lymphedema   10 cm Proximal to Olecranon Process 33.5 cm   Olecranon Process 29.2 cm   10 cm Proximal to Ulnar Styloid Process 24.2 cm   Just  Proximal to Ulnar Styloid Process 17.4 cm   Across Hand at [employer] 19.8 cm   At [place] of 2nd Digit 7.1 cm     Left Upper Extremity Lymphedema   10 cm Proximal to Olecranon Process 33.7 cm   Olecranon Process 28.6 cm   10 cm Proximal to Ulnar Styloid Process 24.4 cm   Just Proximal to Ulnar Styloid Process 17.6 cm   Across Hand at [employer] 19.6 cm   At [place] of 2nd Digit 6.9 cm      Patient was instructed today in a home exercise program today for post op shoulder range of motion. These included active assist shoulder flexion in sitting, scapular retraction, wall walking with shoulder abduction, and hands behind head external rotation.  She was encouraged to do these twice a day, holding 3 seconds and repeating 5 times when permitted by her physician.         PT Education - 11/20/15 1607    Education provided Yes   Education Details Lymphedema risk reduction and post op shoulder ROM HEP   Person(s) Educated Patient;Spouse;Child(ren)   Methods Explanation;Demonstration;Handout   Comprehension Returned demonstration;Verbalized understanding              Breast Clinic Goals - 11/20/15 1610      Patient will be able to verbalize understanding of pertinent lymphedema risk reduction practices relevant to her diagnosis specifically related to skin care.   Time 1   Period Days   Status Achieved     Patient will be able to return demonstrate and/or verbalize understanding of the post-op home exercise program related to regaining shoulder range of motion.   Time 1   Period Days   Status Achieved     Patient will be able to verbalize understanding of the importance of attending the postoperative After Breast Cancer Class for further lymphedema risk reduction education and therapeutic exercise.   Time 1   Period Days   Status Achieved              Plan - 11/20/15 1607    Clinical Impression Statement Patient was diagnosed on 10/29/15 with left invasive  ductal carcinoma breast cancer.  It is grade 1, ER/PR positive, HER2 negative and has a Ki67 of 3%.  It measures 8 mm and is located in the upper inner quadrant.  Her multidisciplinary medical team met prior to her assessments to determine a recommended treatment plan.  She is planning to have a left lumpectomy with a sentinel node biopsy followed by  radiation and anti-estrogen therapy.  She may benefit from post op PT to regain shoulder ROM and reduce lymphedema risk.  Due to her lack of comorbidities, her eval is of low complexity.   Rehab Potential Excellent   Clinical Impairments Affecting Rehab Potential none   PT Frequency One time visit   PT Treatment/Interventions Patient/family education;Therapeutic exercise   PT Next Visit Plan Will f/u after surgery to determine needs   PT Home Exercise Plan Post op shoulder ROM HEP   Consulted and Agree with Plan of Care Patient;Family member/caregiver   Family Member Consulted Husband and 2 grown daughters      Patient will benefit from skilled therapeutic intervention in order to improve the following deficits and impairments:  Decreased knowledge of precautions, Pain, Impaired UE functional use, Decreased range of motion  Visit Diagnosis: Carcinoma of left breast upper inner quadrant ([place]) - Plan: PT plan of care cert/re-cert  Abnormal posture - Plan: PT plan of care cert/re-cert   Patient will follow up at outpatient cancer rehab if needed following surgery.  If the patient requires physical therapy at that time, a specific plan will be dictated and sent to the referring physician for approval. The patient was educated today on appropriate basic range of motion exercises to begin post operatively and the importance of attending the After Breast Cancer class following surgery.  Patient was educated today on lymphedema risk reduction practices as it pertains to recommendations that will benefit the patient immediately following surgery.  She  verbalized good understanding.  No additional physical therapy is indicated at this time.      Problem List Patient Active Problem List   Diagnosis Date Noted  . Breast cancer of upper-inner quadrant of left female breast ([place]) 11/15/2015    Annia Friendly, PT 11/20/15 4:13 PM  [place], [place], [address] Phone: [phone]   Fax:  [phone]  Name: Christina Rivera MRN: [id number] Date of Birth: 10/01/1955

## 2015-11-24 NOTE — Progress Notes (Signed)
Radiation Oncology         [phone] ________________________________  Name: Christina Rivera MRN: [id number]  Date: 11/20/2015  DOB: [birth year]  IR:SWNIO,EVOJJK DAVIDSON, MD  Stark Klein, MD     REFERRING PHYSICIAN: Stark Klein, MD   DIAGNOSIS: The encounter diagnosis was Breast cancer of upper-inner quadrant of left female breast ([place]).  Breast cancer of upper-inner quadrant of left female breast [facility])   Staging form: Breast, AJCC 7th Edition   - Clinical stage from 11/20/2015: Stage IA (T1b, N0, M0) - Unsigned   HISTORY OF PRESENT ILLNESS::Christina Rivera is a 60 y.o. female who is seen for an initial consultation visit regarding the patient's diagnosis of breast cancer.  The patient was found to have suspicious findings within the left  breast on initial mammogram. The patient has not had symptoms prior to this study. A diagnostic mammogram and breast ultrasound confirmed this finding. On ultrasound, the tumor measured 0.8 cm. no suspicious findings were seen within the axilla.  A biopsy was performed. 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 Receptors studies were completed and indicate that the tumor is estrogen receptor positive, progesterone receptor positive, and Her-2/neu negative. The Ki-67 staining was 3%.   The patient has not undergone an MRI scan of the breasts. Notably, the patient does have bilateral implants present.    PREVIOUS RADIATION THERAPY: No   PAST MEDICAL HISTORY:  has a past medical history of Anxiety; Arthritis; Hot flashes; degenerative disc disease; and Vertigo.     PAST SURGICAL HISTORY: Past Surgical History:  Procedure Laterality Date  . bilateral hammertoe repair    . PLACEMENT OF BREAST IMPLANTS       FAMILY HISTORY: family history includes Lung cancer in her father.   SOCIAL HISTORY:  reports that she has never smoked. She has never used smokeless tobacco. She reports that she drinks  alcohol. She reports that she does not use drugs.   ALLERGIES: Tramadol; Doxycycline; and Neomycin   MEDICATIONS:  Current Outpatient Prescriptions  Medication Sig Dispense Refill  . fexofenadine (ALLEGRA) 180 MG tablet Take 180 mg by mouth daily.    Marland Kitchen FINACEA 15 % cream     . medroxyPROGESTERone (PROVERA) 10 MG tablet     . Triamcinolone Acetonide (NASACORT ALLERGY 24HR NA) Place into the nose daily.     No current facility-administered medications for this encounter.      REVIEW OF SYSTEMS:  A 15 point review of systems is documented in the electronic medical record. This was obtained by the nursing staff. However, I reviewed this with the patient to discuss relevant findings and make appropriate changes.  Pertinent items are noted in HPI.    PHYSICAL EXAM:  vitals were not taken for this visit.  ECOG = 0  0 - Asymptomatic (Fully active, able to carry on all predisease activities without restriction)  1 - Symptomatic but completely ambulatory (Restricted in physically strenuous activity but ambulatory and able to carry out work of a light or sedentary nature. For example, light housework, office work)  2 - Symptomatic, <50% in bed during the day (Ambulatory and capable of all self care but unable to carry out any work activities. Up and about more than 50% of waking hours)  3 - Symptomatic, >50% in bed, but not bedbound (Capable of only limited self-care, confined to bed or chair 50% or more of waking hours)  4 - Bedbound (Completely disabled. Cannot carry on any self-care.  Totally confined to bed or chair)  5 - Death   Eustace Pen MM, Creech RH, Tormey DC, et al. [phone]). "Toxicity and response criteria of the [facility]". [place] Oncol. 5 (6): 649-55  General: Well-developed, in no acute distress HEENT: Normocephalic, atraumatic; oral cavity clear Neck: Supple without any lymphadenopathy Cardiovascular: Regular rate and rhythm Respiratory: Clear to  auscultation bilaterally Breasts: Bilateral implants are present. The patient is status post biopsy of the left breast with underlying biopsy change. No suspicious findings present within the axilla. GI: Soft, nontender, normal bowel sounds Extremities: No edema present Neuro: No focal deficits     LABORATORY DATA:  Lab Results  Component Value Date   WBC 9.1 11/20/2015   HGB 14.2 11/20/2015   HCT 43.5 11/20/2015   MCV 88.0 11/20/2015   PLT 291 11/20/2015   Lab Results  Component Value Date   NA 141 11/20/2015   K 3.8 11/20/2015   CL 102 09/29/2006   CO2 25 11/20/2015   Lab Results  Component Value Date   ALT 24 11/20/2015   AST 18 11/20/2015   ALKPHOS 55 11/20/2015   BILITOT 0.37 11/20/2015      RADIOGRAPHY: No results found.     IMPRESSION:    Breast cancer of upper-inner quadrant of left female breast ([place])   11/13/2015 Initial Diagnosis    Left breast biopsy: Invasive ductal carcinoma with ADH, grade 1, ER 90%, PR 90%, Ki-67 3%, HER-2 negative ratio 1.35 copy #2; left breast spiculated mass 8 mm at 9:00 position, axilla negative, T1 BN 0 stage IA clinical stage     Breast cancer of upper-inner quadrant of left female breast [facility])   Staging form: Breast, AJCC 7th Edition   - Clinical stage from 11/20/2015: Stage IA (T1b, N0, M0) - Unsigned  The patient has a recent diagnosis of Invasive ductal carcinoma of the left breast. She appears to be a good candidate for breast conservation treatment.  I discussed with the patient the role of adjuvant radiation treatment in this setting. We discussed the potential benefit of radiation treatment, especially with regards to local control of the patient's tumor. We also discussed the possible side effects and risks of such a treatment as well.  All of the patient's questions were answered. The patient wishes to proceed with radiation treatment at the appropriate time.  PLAN: I look forward to seeing the patient  postoperatively to review her case and further discuss and coordinate an anticipated course of radiation treatment. Currently, the patient is planned to undergo breast conservation treatment. She will have an Oncotype test performed on the surgical specimen and it is anticipated that at the appropriate time she will proceed with adjuvant radiation treatment followed by anti-hormonal medication.      ________________________________   Jodelle Gross, MD, PhD   **Disclaimer: This note was dictated with voice recognition software. Similar sounding words can inadvertently be transcribed and this note may contain transcription errors which may not have been corrected upon publication of note.**

## 2015-11-25 ENCOUNTER — Encounter: Payer: Self-pay | Admitting: General Practice

## 2015-11-25 NOTE — Progress Notes (Signed)
[place] Psychosocial Distress Screening Spiritual Care  Spiritual Care was referred by distress screening protocol.  The patient scored a 8 on the Psychosocial Distress Thermometer which indicates severe distress. Reached Christina Rivera by phone to assess for distress and other psychosocial needs.   ONCBCN DISTRESS SCREENING 11/25/2015  Screening Type Initial Screening  Distress experienced in past week (1-10) 8  Emotional problem type Nervousness/Anxiety;Adjusting to illness  Information Concerns Type Lack of info about diagnosis  Physical Problem type Nausea/vomiting;Sleep/insomnia  Referral to support programs Yes  Other Spiritual Care   Christina Rivera engaged readily, laughing and sharing frankly about scheduling difficulties she encountered today.  She describes herself as "an anxious person anyway" and was just setting out to take a walk for stress relief when I called.  She indicates no other needs at this time, but does recognize value in receiving emotional support from people outside family/social circles.  Follow up needed: No.  Pt has full packet on [facility] team/programming, but please also page if needs arise/circumstances change.  Thank you.  [place], [place], [facility] Pager [phone] Voicemail [phone]

## 2015-11-26 ENCOUNTER — Encounter (HOSPITAL_BASED_OUTPATIENT_CLINIC_OR_DEPARTMENT_OTHER): Payer: Self-pay | Admitting: *Deleted

## 2015-11-26 ENCOUNTER — Telehealth: Payer: Self-pay | Admitting: *Deleted

## 2015-11-26 NOTE — Telephone Encounter (Signed)
Left message for a return phone call to follow up from [facility] 11/20/15.

## 2015-11-27 ENCOUNTER — Telehealth: Payer: Self-pay | Admitting: Hematology and Oncology

## 2015-11-27 NOTE — Telephone Encounter (Signed)
Spoke with pt to confirm 8/23 appt at 1030 am per pof

## 2015-12-03 NOTE — Progress Notes (Signed)
Pt given 8 oz carton of boost breeze with verbal and written instructions (teach back) to drink at 1100 morning of surgery and no other liquid after midnight,  Voiced understanding

## 2015-12-04 ENCOUNTER — Ambulatory Visit (HOSPITAL_BASED_OUTPATIENT_CLINIC_OR_DEPARTMENT_OTHER)
Admission: RE | Admit: 2015-12-04 | Discharge: 2015-12-04 | Disposition: A | Discharge status: Home / Self Care | Payer: 59 | Source: Ambulatory Visit | Attending: General Surgery | Admitting: General Surgery

## 2015-12-04 ENCOUNTER — Encounter (HOSPITAL_BASED_OUTPATIENT_CLINIC_OR_DEPARTMENT_OTHER): Payer: Self-pay

## 2015-12-04 ENCOUNTER — Ambulatory Visit (HOSPITAL_BASED_OUTPATIENT_CLINIC_OR_DEPARTMENT_OTHER): Payer: 59 | Admitting: Anesthesiology

## 2015-12-04 ENCOUNTER — Encounter (HOSPITAL_BASED_OUTPATIENT_CLINIC_OR_DEPARTMENT_OTHER)
Admission: RE | Disposition: A | Discharge status: Home / Self Care | Payer: Self-pay | Source: Ambulatory Visit | Attending: General Surgery

## 2015-12-04 ENCOUNTER — Ambulatory Visit (HOSPITAL_COMMUNITY)
Admission: RE | Admit: 2015-12-04 | Discharge: 2015-12-04 | Disposition: A | Discharge status: Home / Self Care | Payer: 59 | Source: Ambulatory Visit | Attending: General Surgery | Admitting: General Surgery

## 2015-12-04 DIAGNOSIS — Z79899 Other long term (current) drug therapy: Secondary | ICD-10-CM | POA: Insufficient documentation

## 2015-12-04 DIAGNOSIS — C50212 Malignant neoplasm of upper-inner quadrant of left female breast: Secondary | ICD-10-CM | POA: Insufficient documentation

## 2015-12-04 DIAGNOSIS — D0512 Intraductal carcinoma in situ of left breast: Secondary | ICD-10-CM | POA: Diagnosis not present

## 2015-12-04 DIAGNOSIS — C50312 Malignant neoplasm of lower-inner quadrant of left female breast: Secondary | ICD-10-CM

## 2015-12-04 DIAGNOSIS — Z170 Estrogen receptor positive status [ER+]: Secondary | ICD-10-CM | POA: Diagnosis not present

## 2015-12-04 HISTORY — PX: BREAST LUMPECTOMY WITH RADIOACTIVE SEED AND SENTINEL LYMPH NODE BIOPSY: SHX6550

## 2015-12-04 HISTORY — DX: Malignant (primary) neoplasm, unspecified: C80.1

## 2015-12-04 HISTORY — DX: Nontoxic multinodular goiter: E04.2

## 2015-12-04 SURGERY — BREAST LUMPECTOMY WITH RADIOACTIVE SEED AND SENTINEL LYMPH NODE BIOPSY
Anesthesia: General | Site: Breast | Laterality: Left

## 2015-12-04 MED ORDER — ONDANSETRON HCL 4 MG/2ML IJ SOLN
INTRAMUSCULAR | Status: AC
Start: 2015-12-04 — End: 2015-12-04
  Filled 2015-12-04: qty 2

## 2015-12-04 MED ORDER — PROPOFOL 10 MG/ML IV BOLUS
INTRAVENOUS | Status: AC
  Filled 2015-12-04: qty 20

## 2015-12-04 MED ORDER — MIDAZOLAM HCL 2 MG/2ML IJ SOLN: 1.0000 mg | INTRAMUSCULAR | Status: DC | PRN

## 2015-12-04 MED ORDER — FENTANYL CITRATE (PF) 100 MCG/2ML IJ SOLN
50.0000 ug | INTRAMUSCULAR | Status: DC | PRN
  Administered 2015-12-04: 100 ug via INTRAVENOUS
  Administered 2015-12-04: 50 ug via INTRAVENOUS

## 2015-12-04 MED ORDER — GABAPENTIN 300 MG PO CAPS
300.0000 mg | ORAL_CAPSULE | ORAL | Status: AC
  Administered 2015-12-04: 300 mg via ORAL

## 2015-12-04 MED ORDER — OXYCODONE HCL 5 MG PO TABS
5.0000 mg | ORAL_TABLET | Freq: Once | ORAL | Status: AC | PRN
  Administered 2015-12-04: 5 mg via ORAL

## 2015-12-04 MED ORDER — LIDOCAINE-EPINEPHRINE (PF) 1 %-1:200000 IJ SOLN
INTRAMUSCULAR | Status: DC | PRN
  Administered 2015-12-04: 10 mL

## 2015-12-04 MED ORDER — HYDROMORPHONE HCL 1 MG/ML IJ SOLN
INTRAMUSCULAR | Status: AC
  Filled 2015-12-04: qty 1

## 2015-12-04 MED ORDER — FENTANYL CITRATE (PF) 100 MCG/2ML IJ SOLN
INTRAMUSCULAR | Status: AC
  Filled 2015-12-04: qty 2

## 2015-12-04 MED ORDER — OXYCODONE HCL 5 MG PO TABS: 5.0000 mg | ORAL_TABLET | Freq: Four times a day (QID) | ORAL | 0 refills | Status: DC | PRN

## 2015-12-04 MED ORDER — HYDROMORPHONE HCL 1 MG/ML IJ SOLN
0.2500 mg | INTRAMUSCULAR | Status: DC | PRN
  Administered 2015-12-04 (×2): 0.5 mg via INTRAVENOUS

## 2015-12-04 MED ORDER — ACETAMINOPHEN 500 MG PO TABS
ORAL_TABLET | ORAL | Status: AC
  Filled 2015-12-04: qty 2

## 2015-12-04 MED ORDER — MIDAZOLAM HCL 2 MG/2ML IJ SOLN
INTRAMUSCULAR | Status: AC
  Filled 2015-12-04: qty 2

## 2015-12-04 MED ORDER — BUPIVACAINE HCL (PF) 0.25 % IJ SOLN
INTRAMUSCULAR | Status: AC
  Filled 2015-12-04: qty 30

## 2015-12-04 MED ORDER — CHLORHEXIDINE GLUCONATE CLOTH 2 % EX PADS: 6.0000 | MEDICATED_PAD | Freq: Once | CUTANEOUS | Status: DC

## 2015-12-04 MED ORDER — METHYLENE BLUE 0.5 % INJ SOLN
INTRAVENOUS | Status: AC
  Filled 2015-12-04: qty 10

## 2015-12-04 MED ORDER — ONDANSETRON HCL 4 MG/2ML IJ SOLN
INTRAMUSCULAR | Status: DC | PRN
  Administered 2015-12-04: 4 mg via INTRAVENOUS

## 2015-12-04 MED ORDER — GLYCOPYRROLATE 0.2 MG/ML IJ SOLN: 0.2000 mg | Freq: Once | INTRAMUSCULAR | Status: DC | PRN

## 2015-12-04 MED ORDER — LIDOCAINE-EPINEPHRINE (PF) 1 %-1:200000 IJ SOLN
INTRAMUSCULAR | Status: AC
  Filled 2015-12-04: qty 30

## 2015-12-04 MED ORDER — CEFAZOLIN SODIUM-DEXTROSE 2-4 GM/100ML-% IV SOLN
2.0000 g | INTRAVENOUS | Status: AC
  Administered 2015-12-04: 2 g via INTRAVENOUS

## 2015-12-04 MED ORDER — FENTANYL CITRATE (PF) 100 MCG/2ML IJ SOLN
50.0000 ug | INTRAMUSCULAR | Status: AC | PRN
  Administered 2015-12-04: 25 ug via INTRAVENOUS
  Administered 2015-12-04 (×2): 50 ug via INTRAVENOUS
  Administered 2015-12-04 (×3): 25 ug via INTRAVENOUS

## 2015-12-04 MED ORDER — DEXAMETHASONE SODIUM PHOSPHATE 10 MG/ML IJ SOLN
INTRAMUSCULAR | Status: AC
Start: 2015-12-04 — End: 2015-12-04
  Filled 2015-12-04: qty 1

## 2015-12-04 MED ORDER — BUPIVACAINE-EPINEPHRINE (PF) 0.5% -1:200000 IJ SOLN
INTRAMUSCULAR | Status: DC | PRN
  Administered 2015-12-04: 30 mL

## 2015-12-04 MED ORDER — LACTATED RINGERS IV SOLN
INTRAVENOUS | Status: DC
  Administered 2015-12-04 (×2): via INTRAVENOUS

## 2015-12-04 MED ORDER — MIDAZOLAM HCL 2 MG/2ML IJ SOLN
1.0000 mg | INTRAMUSCULAR | Status: DC | PRN
  Administered 2015-12-04: 2 mg via INTRAVENOUS
  Administered 2015-12-04: 1 mg via INTRAVENOUS

## 2015-12-04 MED ORDER — GABAPENTIN 300 MG PO CAPS
ORAL_CAPSULE | ORAL | Status: AC
  Filled 2015-12-04: qty 1

## 2015-12-04 MED ORDER — LACTATED RINGERS IV SOLN
INTRAVENOUS | Status: DC
  Administered 2015-12-04: 14:00:00 via INTRAVENOUS

## 2015-12-04 MED ORDER — TECHNETIUM TC 99M SULFUR COLLOID FILTERED
1.0000 | Freq: Once | INTRAVENOUS | Status: AC | PRN
  Administered 2015-12-04: 1 via INTRADERMAL

## 2015-12-04 MED ORDER — PROPOFOL 10 MG/ML IV BOLUS
INTRAVENOUS | Status: DC | PRN
  Administered 2015-12-04: 200 mg via INTRAVENOUS
  Administered 2015-12-04: 100 mg via INTRAVENOUS

## 2015-12-04 MED ORDER — DEXAMETHASONE SODIUM PHOSPHATE 4 MG/ML IJ SOLN
INTRAMUSCULAR | Status: DC | PRN
  Administered 2015-12-04: 10 mg via INTRAVENOUS

## 2015-12-04 MED ORDER — PROMETHAZINE HCL 25 MG/ML IJ SOLN
6.2500 mg | INTRAMUSCULAR | Status: DC | PRN
  Administered 2015-12-04: 6.25 mg via INTRAVENOUS

## 2015-12-04 MED ORDER — CELECOXIB 400 MG PO CAPS
400.0000 mg | ORAL_CAPSULE | ORAL | Status: AC
  Administered 2015-12-04: 400 mg via ORAL

## 2015-12-04 MED ORDER — ACETAMINOPHEN 500 MG PO TABS
1000.0000 mg | ORAL_TABLET | ORAL | Status: AC
  Administered 2015-12-04: 1000 mg via ORAL

## 2015-12-04 MED ORDER — PROMETHAZINE HCL 25 MG/ML IJ SOLN
INTRAMUSCULAR | Status: AC
  Filled 2015-12-04: qty 1

## 2015-12-04 MED ORDER — SODIUM CHLORIDE 0.9 % IJ SOLN
INTRAMUSCULAR | Status: AC
  Filled 2015-12-04: qty 10

## 2015-12-04 MED ORDER — SCOPOLAMINE 1 MG/3DAYS TD PT72: 1.0000 | MEDICATED_PATCH | Freq: Once | TRANSDERMAL | Status: DC | PRN

## 2015-12-04 MED ORDER — OXYCODONE HCL 5 MG PO TABS
ORAL_TABLET | ORAL | Status: AC
  Filled 2015-12-04: qty 1

## 2015-12-04 MED ORDER — CELECOXIB 200 MG PO CAPS
ORAL_CAPSULE | ORAL | Status: AC
  Filled 2015-12-04: qty 2

## 2015-12-04 MED ORDER — CEFAZOLIN SODIUM-DEXTROSE 2-4 GM/100ML-% IV SOLN
INTRAVENOUS | Status: AC
  Filled 2015-12-04: qty 100

## 2015-12-04 SURGICAL SUPPLY — 69 items
ADH SKN CLS APL DERMABOND .7 (GAUZE/BANDAGES/DRESSINGS) ×1
APPLIER CLIP 9.375 MED OPEN (MISCELLANEOUS)
APR CLP MED 9.3 20 MLT OPN (MISCELLANEOUS)
BINDER BREAST LRG (GAUZE/BANDAGES/DRESSINGS) IMPLANT
BINDER BREAST MEDIUM (GAUZE/BANDAGES/DRESSINGS) IMPLANT
BINDER BREAST XLRG (GAUZE/BANDAGES/DRESSINGS) IMPLANT
BINDER BREAST XXLRG (GAUZE/BANDAGES/DRESSINGS) ×2 IMPLANT
BLADE SURG 10 STRL SS (BLADE) ×3 IMPLANT
BLADE SURG 15 STRL LF DISP TIS (BLADE) ×1 IMPLANT
BLADE SURG 15 STRL SS (BLADE) ×3
BNDG COHESIVE 4X5 TAN STRL (GAUZE/BANDAGES/DRESSINGS) ×3 IMPLANT
CANISTER SUC SOCK COL 7IN (MISCELLANEOUS) IMPLANT
CANISTER SUCT 1200ML W/VALVE (MISCELLANEOUS) ×3 IMPLANT
CHLORAPREP W/TINT 26ML (MISCELLANEOUS) ×3 IMPLANT
CLIP APPLIE 9.375 MED OPEN (MISCELLANEOUS) IMPLANT
CLIP TI LARGE 6 (CLIP) ×3 IMPLANT
CLIP TI MEDIUM 6 (CLIP) ×4 IMPLANT
CLIP TI WIDE RED SMALL 6 (CLIP) IMPLANT
CLOSURE WOUND 1/2 X4 (GAUZE/BANDAGES/DRESSINGS) ×1
COVER MAYO STAND STRL (DRAPES) ×3 IMPLANT
COVER PROBE W GEL 5X96 (DRAPES) ×3 IMPLANT
DECANTER SPIKE VIAL GLASS SM (MISCELLANEOUS) IMPLANT
DERMABOND ADVANCED (GAUZE/BANDAGES/DRESSINGS) ×2
DERMABOND ADVANCED .7 DNX12 (GAUZE/BANDAGES/DRESSINGS) ×1 IMPLANT
DEVICE DUBIN W/COMP PLATE 8390 (MISCELLANEOUS) ×3 IMPLANT
DRAPE UTILITY XL STRL (DRAPES) ×3 IMPLANT
DRSG PAD ABDOMINAL 8X10 ST (GAUZE/BANDAGES/DRESSINGS) ×2 IMPLANT
ELECT COATED BLADE 2.86 ST (ELECTRODE) ×3 IMPLANT
ELECT REM PT RETURN 9FT ADLT (ELECTROSURGICAL) ×3
ELECTRODE REM PT RTRN 9FT ADLT (ELECTROSURGICAL) ×1 IMPLANT
GAUZE SPONGE 4X4 16PLY XRAY LF (GAUZE/BANDAGES/DRESSINGS) ×2 IMPLANT
GLOVE BIO SURGEON STRL SZ 6 (GLOVE) ×3 IMPLANT
GLOVE BIOGEL PI IND STRL 6.5 (GLOVE) ×1 IMPLANT
GLOVE BIOGEL PI INDICATOR 6.5 (GLOVE) ×2
GOWN STRL REUS W/ TWL LRG LVL3 (GOWN DISPOSABLE) ×1 IMPLANT
GOWN STRL REUS W/TWL 2XL LVL3 (GOWN DISPOSABLE) ×3 IMPLANT
GOWN STRL REUS W/TWL LRG LVL3 (GOWN DISPOSABLE) ×3
ILLUMINATOR WAVEGUIDE N/F (MISCELLANEOUS) IMPLANT
KIT MARKER MARGIN INK (KITS) ×3 IMPLANT
LIGHT WAVEGUIDE WIDE FLAT (MISCELLANEOUS) IMPLANT
NDL HYPO 25X1 1.5 SAFETY (NEEDLE) ×1 IMPLANT
NDL SAFETY ECLIPSE 18X1.5 (NEEDLE) IMPLANT
NEEDLE HYPO 18GX1.5 SHARP (NEEDLE)
NEEDLE HYPO 25X1 1.5 SAFETY (NEEDLE) ×3 IMPLANT
NS IRRIG 1000ML POUR BTL (IV SOLUTION) ×3 IMPLANT
PACK BASIN DAY SURGERY FS (CUSTOM PROCEDURE TRAY) ×3 IMPLANT
PACK UNIVERSAL I (CUSTOM PROCEDURE TRAY) ×3 IMPLANT
PENCIL BUTTON HOLSTER BLD 10FT (ELECTRODE) ×3 IMPLANT
SLEEVE SCD COMPRESS KNEE MED (MISCELLANEOUS) ×3 IMPLANT
SPONGE GAUZE 4X4 12PLY STER LF (GAUZE/BANDAGES/DRESSINGS) ×3 IMPLANT
SPONGE LAP 18X18 X RAY DECT (DISPOSABLE) ×6 IMPLANT
STAPLER VISISTAT 35W (STAPLE) IMPLANT
STOCKINETTE IMPERVIOUS LG (DRAPES) ×3 IMPLANT
STRIP CLOSURE SKIN 1/2X4 (GAUZE/BANDAGES/DRESSINGS) ×2 IMPLANT
SUT ETHILON 2 0 FS 18 (SUTURE) IMPLANT
SUT MNCRL AB 4-0 PS2 18 (SUTURE) ×3 IMPLANT
SUT MON AB 5-0 PS2 18 (SUTURE) IMPLANT
SUT SILK 2 0 SH (SUTURE) IMPLANT
SUT VIC AB 2-0 SH 27 (SUTURE) ×3
SUT VIC AB 2-0 SH 27XBRD (SUTURE) ×1 IMPLANT
SUT VIC AB 3-0 SH 27 (SUTURE) ×3
SUT VIC AB 3-0 SH 27X BRD (SUTURE) ×1 IMPLANT
SUT VIC AB 5-0 PS2 18 (SUTURE) IMPLANT
SYR CONTROL 10ML LL (SYRINGE) ×3 IMPLANT
TOWEL OR 17X24 6PK STRL BLUE (TOWEL DISPOSABLE) ×3 IMPLANT
TOWEL OR NON WOVEN STRL DISP B (DISPOSABLE) ×3 IMPLANT
TUBE CONNECTING 20'X1/4 (TUBING) ×1
TUBE CONNECTING 20X1/4 (TUBING) ×2 IMPLANT
YANKAUER SUCT BULB TIP NO VENT (SUCTIONS) ×3 IMPLANT

## 2015-12-04 NOTE — Anesthesia Procedure Notes (Signed)
Anesthesia Regional Block:  Pectoralis block  Pre-Anesthetic Checklist: ,, timeout performed, Correct Patient, Correct Site, Correct Laterality, Correct Procedure, Correct Position, site marked, Risks and benefits discussed,  Surgical consent,  Pre-op evaluation,  At surgeon's request and post-op pain management  Laterality: Left  Prep: chloraprep       Needles:  Injection technique: Single-shot  Needle Type: Echogenic Stimulator Needle     Needle Length: 10cm 10 cm Needle Gauge: 21 and 21 G    Additional Needles:  Procedures: ultrasound guided (picture in chart) Pectoralis block Narrative:  Start time: 12/04/2015 2:33 PM End time: 12/04/2015 2:43 PM Injection made incrementally with aspirations every 5 mL.  Performed by: Personally

## 2015-12-04 NOTE — Anesthesia Procedure Notes (Signed)
Procedure Name: LMA Insertion Date/Time: 12/04/2015 3:13 PM Performed by: Lieutenant Diego Pre-anesthesia Checklist: Patient identified, Emergency Drugs available, Suction available and Patient being monitored Patient Re-evaluated:Patient Re-evaluated prior to inductionOxygen Delivery Method: Circle system utilized Preoxygenation: Pre-oxygenation with 100% oxygen Intubation Type: IV induction Ventilation: Mask ventilation without difficulty LMA: LMA inserted LMA Size: 4.0 Number of attempts: 1 Airway Equipment and Method: Bite block Placement Confirmation: positive ETCO2 and breath sounds checked- equal and bilateral Tube secured with: Tape Dental Injury: Teeth and Oropharynx as per pre-operative assessment

## 2015-12-04 NOTE — Interval H&P Note (Signed)
History and Physical Interval Note:  12/04/2015 2:58 PM  Christina Rivera  has presented today for surgery, with the diagnosis of LEFT BREAST CANCER  The various methods of treatment have been discussed with the patient and family. After consideration of risks, benefits and other options for treatment, the patient has consented to  Procedure(s) with comments: BREAST LUMPECTOMY WITH RADIOACTIVE SEED AND SENTINEL LYMPH NODE BIOPSY (Left) - BREAST LUMPECTOMY WITH RADIOACTIVE SEED AND SENTINEL LYMPH NODE BIOPSY as a surgical intervention .  The patient's history has been reviewed, patient examined, no change in status, stable for surgery.  I have reviewed the patient's chart and labs.  Questions were answered to the patient's satisfaction.     [name]

## 2015-12-04 NOTE — Transfer of Care (Signed)
Immediate Anesthesia Transfer of Care Note  Patient: Christina Rivera  Procedure(s) Performed: Procedure(s) with comments: BREAST LUMPECTOMY WITH RADIOACTIVE SEED AND SENTINEL LYMPH NODE BIOPSY (Left) - BREAST LUMPECTOMY WITH RADIOACTIVE SEED AND SENTINEL LYMPH NODE BIOPSY  Patient Location: PACU  Anesthesia Type:General and GA combined with regional for post-op pain  Level of Consciousness: awake and alert   Airway & Oxygen Therapy: Patient Spontanous Breathing and Patient connected to face mask oxygen  Post-op Assessment: Report given to RN and Post -op Vital signs reviewed and stable  Post vital signs: Reviewed and stable  Last Vitals:  Vitals:   12/04/15 1445 12/04/15 1450  BP: (!) 145/66   Pulse: 83 83  Resp: (!) 21 17  Temp:      Last Pain:  Vitals:   12/04/15 1335  TempSrc: Oral         Complications: No apparent anesthesia complications

## 2015-12-04 NOTE — H&P (Signed)
Christina Rivera 11/20/2015 7:57 AM Location: [facility] Patient #: [id number] DOB: [birth year] Undefined / Language: Cleophus Molt / Race: White Female   History of Present Illness Stark Klein MD; 11/20/2015 4:18 PM) The patient is a 60 year old female who presents with breast cancer. Pt is referred for consultation at the request of Dr. Pamala Hurry for new dx of left breast cancer. Patient is a 60 year old female who presented with a screening detected mass in the left breast in the upper inner quadrant. She did not feel any breast palpable abnormality. She does have a history of bilateral implants placed in 1985. Diagnostic imaging revealed a 70 mm mass at 9:00. Core needle biopsy was performed and showed grade 1 invasive ductal carcinoma that was ER and PR positive, HER-2/neu negative, and Ki-67 3%.  Patient has a father that had stage IV lung cancer at age 51. She also knows that he had precancerous colon polyps.  The patient had menarche at age 63. She stopped having periods around 6 years ago. She does not use any hormone replacement since then until just recently when she started medroxyprogesterone. She is a G2 P2 with the first child born at age 42. She used birth control pills for 10 years and uses Klonopin and her fertility in 27. She is up-to-date with her colonoscopy with the last one being around 2008. She also has had a bone density study. Last Pap smear was in June 2017.    Pathology Diagnosis Breast, left, needle core biopsy - INVASIVE DUCTAL CARCINOMA. - ATYPICAL DUCTAL HYPERPLASIA. - SEE COMMENT Microscopic Comment The carcinoma appears grade 1.  CMET, CBC essentially normal   dx ultrasound 7/25 8 mm irregular mass with indistinct margin in the left breast upper inner quadrant anterior depth. hypoechoic, anterior to implant.   dx mammogram 7/25 7 mm irregular mass with spiculated margin in the left breast cnetral to nipple anterior depth.    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 Breast Biopsy Left. Foot Surgery Bilateral.  Diagnostic Studies History Tawni Pummel, RN; 11/20/2015 7:57 AM) Colonoscopy 5-10 years ago Mammogram within last year Pap Smear 1-5 years ago  Medication History Tawni Pummel, RN; 11/20/2015 7:57 AM) Medications Reconciled  Social History Tawni Pummel, RN; 11/20/2015 7:57 AM) Alcohol use Occasional alcohol use. Caffeine use Carbonated beverages, Coffee, Tea. No drug use Tobacco use Never smoker.  Family History Tawni Pummel, RN; 11/20/2015 7:57 AM) Alcohol Abuse Brother. Anesthetic complications Mother. Arthritis Father, Mother. Bleeding disorder Mother. Cerebrovascular Accident Brother. Colon Polyps Father. Hypertension Brother. Kidney Disease Father. Respiratory Condition Father. Thyroid problems Mother.  Pregnancy / Birth History Tawni Pummel, RN; 11/20/2015 7:57 AM) Age at menarche 64 years. Age of menopause 51-55 Contraceptive History Oral contraceptives. Gravida 4 Length (months) of breastfeeding 3-6 Maternal age 14-35 Para 1    Review of Systems Tawni Pummel RN; 11/20/2015 7:57 AM) General Present- Fatigue, Night Sweats and Weight Loss. Not Present- Appetite Loss, Chills, Fever and Weight Gain. Skin Not Present- Change in Wart/Mole, Dryness, Hives, Jaundice, New Lesions, Non-Healing Wounds, Rash and Ulcer. HEENT Present- Hoarseness, Seasonal Allergies and Wears glasses/contact lenses. Not Present- Earache, Hearing Loss, Nose Bleed, Oral Ulcers, Ringing in the Ears, Sinus Pain, Sore Throat, Visual Disturbances and Yellow Eyes. Respiratory Present- Snoring. Not Present- Bloody sputum, Chronic Cough, Difficulty Breathing and Wheezing. Breast Present- Breast Mass and Breast Pain. Not Present- Nipple  Discharge and Skin Changes. Cardiovascular Present- Leg Cramps and Shortness of Breath. Not Present- Chest Pain, Difficulty Breathing Lying Down, Palpitations, Rapid Heart Rate and Swelling of Extremities. Gastrointestinal Present- Excessive gas. Not Present- Abdominal Pain, Bloating, Bloody Stool, Change in Bowel Habits, Chronic diarrhea, Constipation, Difficulty Swallowing, Gets full quickly at meals, Hemorrhoids, Indigestion, Nausea, Rectal Pain and Vomiting. Female Genitourinary Not Present- Frequency, Nocturia, Painful Urination, Pelvic Pain and Urgency. Musculoskeletal Not Present- Back Pain, Joint Pain, Joint Stiffness, Muscle Pain, Muscle Weakness and Swelling of Extremities. Neurological Not Present- Decreased Memory, Fainting, Headaches, Numbness, Seizures, Tingling, Tremor, Trouble walking and Weakness. Psychiatric Not Present- Anxiety, Bipolar, Change in Sleep Pattern, Depression, Fearful and Frequent crying. Endocrine Present- Hot flashes. Not Present- Cold Intolerance, Excessive Hunger, Hair Changes, Heat Intolerance and New Diabetes. Hematology Not Present- Blood Thinners, Easy Bruising, Excessive bleeding, Gland problems, HIV and Persistent Infections.  Vitals Stark Klein MD; 11/20/2015 4:11 PM) 11/20/2015 4:10 PM Weight: 208.8 lb Height: 70in Body Surface Area: 2.13 m Body Mass Index: 29.96 kg/m  Temp.: 20F  Pulse: 87 (Regular)  Resp.: 18 (Unlabored)  BP: 156/78 (Sitting, Left Arm, Standard)       Physical Exam Stark Klein MD; 11/20/2015 4:10 PM) General Mental Status-Alert. General Appearance-Consistent with stated age. Hydration-Well hydrated. Voice-Normal.  Head and Neck Head-normocephalic, atraumatic with no lesions or palpable masses. Trachea-midline. Thyroid Gland Characteristics - normal size and consistency.  Eye Eyeball - Bilateral-Extraocular movements intact. Sclera/Conjunctiva - Bilateral-No scleral icterus.  Chest and  Lung Exam Chest and lung exam reveals -quiet, even and easy respiratory effort with no use of accessory muscles and on auscultation, normal breath sounds, no adventitious sounds and normal vocal resonance. Inspection Chest Wall - Normal. Back - normal.  Breast Note: no palpable mass in either breast. No nipple retraction or skin dimpling. No nipple discharge. Medial left breast hematoma of moderate size. Implants palpable. Mild ptosis of both breasts. no LAD.   Cardiovascular Cardiovascular examination reveals -normal heart sounds, regular rate and rhythm with no murmurs and normal pedal pulses bilaterally.  Abdomen Inspection Inspection of the abdomen reveals - No Hernias. Palpation/Percussion Palpation and Percussion of the abdomen reveal - Soft, Non Tender, No Rebound tenderness, No Rigidity (guarding) and No hepatosplenomegaly. Auscultation Auscultation of the abdomen reveals - Bowel sounds normal.  Neurologic Neurologic evaluation reveals -alert and oriented x 3 with no impairment of recent or remote memory. Mental Status-Normal.  Musculoskeletal Global Assessment -Note: no gross deformities.  Normal Exam - Left-Upper Extremity Strength Normal and Lower Extremity Strength Normal. Normal Exam - Right-Upper Extremity Strength Normal and Lower Extremity Strength Normal.  Lymphatic Head & Neck  General Head & Neck Lymphatics: Bilateral - Description - Normal. Axillary  General Axillary Region: Bilateral - Description - Normal. Tenderness - Non Tender. Femoral & Inguinal  Generalized Femoral & Inguinal Lymphatics: Bilateral - Description - No Generalized lymphadenopathy.    Assessment & Plan Stark Klein MD; 11/20/2015 4:17 PM) PRIMARY CANCER OF UPPER INNER QUADRANT OF LEFT FEMALE BREAST (B51.025) Impression: Patient is a 60 year old female with a new diagnosis of a clinical T1 be N0 left breast cancer in the upper inner quadrant. She is a good candidate  for breast conservation therapy. We are scheduling a seed localized lumpectomy with sentinel lymph node biopsy. I do not think her implants will be a factor. This will be followed by an Oncotype. She also will receive adjuvant radiation and chemotherapy prevention.  I reviewed with the patient what surgery entails. I discussed placement of  a seed. I also reviewed getting a block and the sentinel node injection. Patient reports that she has significant nausea with anesthesia. I have advised her to discuss this with anesthesia. I reviewed that she would need general anesthesia for this surgery and would be able to go home the same day. I reviewed postoperative recovery and restrictions. I advised patient she'll be unable to shower for 2 days postop and will not be able to get into a bathtub or swim for at least 2 weeks.  I discussed wearing a breast binder postoperatively even at night. I reviewed risks of surgery including bleeding, infection, pain, change in breast contour, recurrence of cancer, possible need for additional surgeries or procedures, possible heart or lung complications, possible need for chemotherapy. She understands and wishes to proceed as soon as possible. She currently has a trip scheduled to [place] but would want to change this if we could get her surgery set up earlier.  Note, the patient reports having a reaction to sutures that were used in her foot many years ago. She had surgery on her spine with Dr. Ellene Route in 2008. She had no issues with those sutures. I have looked up those and found that those are Vicryl. The patient also reports what sounds like a vagal reaction after surgery with her foot surgery.  60 min spent in evaluation, examination, counseling, and coordination of care. >50% spent in counseling. Current Plans You are being scheduled for surgery - Our schedulers will call you.  You should hear from our office's scheduling department within 5 working days about the  location, date, and time of surgery. We try to make accommodations for patient's preferences in scheduling surgery, but sometimes the OR schedule or the surgeon's schedule prevents [place] from making those accommodations.  If you have not heard from our office [phone]) in 5 working days, call the office and ask for your surgeon's nurse.  If you have other questions about your diagnosis, plan, or surgery, call the office and ask for your surgeon's nurse.  Pt Education - flb breast cancer surgery: discussed with patient and provided information.   Signed by Stark Klein, MD (11/20/2015 4:18 PM)

## 2015-12-04 NOTE — Anesthesia Preprocedure Evaluation (Addendum)
Anesthesia Evaluation  Patient identified by MRN, date of birth, ID band Patient awake    Reviewed: Allergy & Precautions, NPO status , Patient's Chart, lab work & pertinent test results  History of Anesthesia Complications Negative for: history of anesthetic complications  Airway Mallampati: II  TM Distance: >3 FB Neck ROM: Full    Dental no notable dental hx. (+) Dental Advisory Given   Pulmonary neg pulmonary ROS,    Pulmonary exam normal        Cardiovascular negative cardio ROS Normal cardiovascular exam     Neuro/Psych PSYCHIATRIC DISORDERS Anxiety negative neurological ROS     GI/Hepatic negative GI ROS, Neg liver ROS,   Endo/Other    Renal/GU negative Renal ROS     Musculoskeletal   Abdominal   Peds  Hematology   Anesthesia Other Findings   Reproductive/Obstetrics                           Anesthesia Physical Anesthesia Plan  ASA: II  Anesthesia Plan: General   Post-op Pain Management: GA combined w/ Regional for post-op pain   Induction: Intravenous  Airway Management Planned: LMA  Additional Equipment:   Intra-op Plan:   Post-operative Plan: Extubation in OR  Informed Consent: I have reviewed the patients History and Physical, chart, labs and discussed the procedure including the risks, benefits and alternatives for the proposed anesthesia with the patient or authorized representative who has indicated his/her understanding and acceptance.   Dental advisory given  Plan Discussed with: Anesthesiologist, CRNA and Surgeon  Anesthesia Plan Comments:       Anesthesia Quick Evaluation

## 2015-12-04 NOTE — Discharge Instructions (Addendum)
[facility] °Office Phone Number [phone] ° °BREAST BIOPSY/ PARTIAL MASTECTOMY: POST OP INSTRUCTIONS ° °Always review your discharge instruction sheet given to you by the facility where your surgery was performed. ° °IF YOU HAVE DISABILITY OR FAMILY LEAVE FORMS, YOU MUST BRING THEM TO THE OFFICE FOR PROCESSING.  DO NOT GIVE THEM TO YOUR DOCTOR. ° °1. A prescription for pain medication may be given to you upon discharge.  Take your pain medication as prescribed, if needed.  If narcotic pain medicine is not needed, then you may take acetaminophen (Tylenol) or ibuprofen (Advil) as needed. °2. Take your usually prescribed medications unless otherwise directed °3. If you need a refill on your pain medication, please contact your pharmacy.  They will contact our office to request authorization.  Prescriptions will not be filled after 5pm or on week-ends. °4. You should eat very light the first 24 hours after surgery, such as soup, crackers, pudding, etc.  Resume your normal diet the day after surgery. °5. Most patients will experience some swelling and bruising in the breast.  Ice packs and a good support bra will help.  Swelling and bruising can take several days to resolve.  °6. It is common to experience some constipation if taking pain medication after surgery.  Increasing fluid intake and taking a stool softener will usually help or prevent this problem from occurring.  A mild laxative (Milk of Magnesia or Miralax) should be taken according to package directions if there are no bowel movements after 48 hours. °7. Unless discharge instructions indicate otherwise, you may remove your bandages 48 hours after surgery, and you may shower at that time.  You may have steri-strips (small skin tapes) in place directly over the incision.  These strips should be left on the skin for 7-10 days.   Any sutures or staples will be removed at the office during your follow-up visit. °8. ACTIVITIES:  You may resume  regular daily activities (gradually increasing) beginning the next day.  Wearing a good support bra or sports bra (or the breast binder) minimizes pain and swelling.  You may have sexual intercourse when it is comfortable. °a. You may drive when you no longer are taking prescription pain medication, you can comfortably wear a seatbelt, and you can safely maneuver your car and apply brakes. °b. RETURN TO WORK:  __________1 week_______________ °9. You should see your doctor in the office for a follow-up appointment approximately two weeks after your surgery.  Your doctor’s nurse will typically make your follow-up appointment when she calls you with your pathology report.  Expect your pathology report 2-3 business days after your surgery.  You may call to check if you do not hear from us after three days. ° ° °WHEN TO CALL YOUR DOCTOR: °1. Fever over 101.0 °2. Nausea and/or vomiting. °3. Extreme swelling or bruising. °4. Continued bleeding from incision. °5. Increased pain, redness, or drainage from the incision. ° °The clinic staff is available to answer your questions during regular business hours.  Please don’t hesitate to call and ask to speak to one of the nurses for clinical concerns.  If you have a medical emergency, go to the nearest emergency room or call 911.  A surgeon from [facility] is always on call at the hospital. ° °For further questions, please visit [email or web address]  ° ° °Post Anesthesia Home Care Instructions ° °Activity: °Get plenty of rest for the remainder of the day. A responsible adult should stay with you for 24   hours following the procedure.  °For the next 24 hours, DO NOT: °-Drive a car °-Operate machinery °-Drink alcoholic beverages °-Take any medication unless instructed by your physician °-Make any legal decisions or sign important papers. ° °Meals: °Start with liquid foods such as gelatin or soup. Progress to regular foods as tolerated. Avoid greasy, spicy, heavy  foods. If nausea and/or vomiting occur, drink only clear liquids until the nausea and/or vomiting subsides. Call your physician if vomiting continues. ° °Special Instructions/Symptoms: °Your throat may feel dry or sore from the anesthesia or the breathing tube placed in your throat during surgery. If this causes discomfort, gargle with warm salt water. The discomfort should disappear within 24 hours. ° °If you had a scopolamine patch placed behind your ear for the management of post- operative nausea and/or vomiting: ° °1. The medication in the patch is effective for 72 hours, after which it should be removed.  Wrap patch in a tissue and discard in the trash. Wash hands thoroughly with soap and water. °2. You may remove the patch earlier than 72 hours if you experience unpleasant side effects which may include dry mouth, dizziness or visual disturbances. °3. Avoid touching the patch. Wash your hands with soap and water after contact with the patch. °  ° °

## 2015-12-04 NOTE — Op Note (Signed)
Left Breast Radioactive seed localized lumpectomy and sentinel lymph node biopsy  Indications: This patient presents with history of left breast cancer, upper inner quadrant, cT1bN0  Pre-operative Diagnosis: left breast cancer  Post-operative Diagnosis: same  Surgeon: Stark Klein   Anesthesia: General endotracheal anesthesia  ASA Class: 2  Procedure Details  The patient was seen in the Holding Room. The risks, benefits, complications, treatment options, and expected outcomes were discussed with the patient. The possibilities of bleeding, infection, the need for additional procedures, failure to diagnose a condition, and creating a complication requiring transfusion or operation were discussed with the patient. The patient concurred with the proposed plan, giving informed consent.  The site of surgery properly noted/marked. The patient was taken to Operating Room # 8, identified, and the procedure verified as Left Breast Seed localized Lumpectomy with sentinel lymph node biopsy. A Time Out was held and the above information confirmed.  The left arm, breast, and chest were prepped and draped in standard fashion. The lumpectomy was performed by creating a circumareolar incision over the lower outer quadrant of the breast over the previously placed radioactive seed.  Dissection was carried down to around the point of maximum signal intensity. The cautery was used to perform the dissection.  Hemostasis was achieved with cautery.  The capsule of the implant was encountered.  The specimen was inked with the margin marker paint kit.    Specimen radiography confirmed inclusion of the mammographic lesion, the clip, and the seed.  The background signal in the breast was zero.  The wound was irrigated and closed with 3-0 vicryl in layers and 4-0 monocryl subcuticular suture.    Using a hand-held gamma probe, the axillary sentinel nodes were identified transcutaneously.  An oblique incision was created below  the axillary hairline.  Dissection was carried through the clavipectoral fascia.  Four level 2 axillary sentinel nodes were removed.  Counts per second were below.    The background count was 5 cps.  The wound was irrigated.  Hemostasis was achieved with cautery.  The axillary incision was closed with a 3-0 vicryl deep dermal interrupted sutures and a 4-0 monocryl subcuticular closure.    Sterile dressings were applied. At the end of the operation, all sponge, instrument, and needle counts were correct.  Findings: grossly clear surgical margins and no adenopathy.  Posterior margin is implant capsule.  Anterior margin is skin.  SLN #1 palpable.  SLN #2 hot, cps 720. SLN #3, cps 50, SLN #4 cps 170  Estimated Blood Loss:  min         Specimens: Left breast lumpectomy and four axillary sentinel lymph nodes.             Complications:  None; patient tolerated the procedure well.         Disposition: PACU - hemodynamically stable.         Condition: stable

## 2015-12-04 NOTE — Progress Notes (Signed)
Assisted Dr. Singer with left, ultrasound guided, pectoralis block. Side rails up, monitors on throughout procedure. See vital signs in flow sheet. Tolerated Procedure well. °

## 2015-12-05 ENCOUNTER — Encounter (HOSPITAL_BASED_OUTPATIENT_CLINIC_OR_DEPARTMENT_OTHER): Payer: Self-pay | Admitting: General Surgery

## 2015-12-05 DIAGNOSIS — C50212 Malignant neoplasm of upper-inner quadrant of left female breast: Secondary | ICD-10-CM | POA: Diagnosis not present

## 2015-12-10 NOTE — Progress Notes (Signed)
Please let patient know margins negative and LN negative

## 2015-12-11 ENCOUNTER — Ambulatory Visit (HOSPITAL_BASED_OUTPATIENT_CLINIC_OR_DEPARTMENT_OTHER): Payer: 59 | Admitting: Hematology and Oncology

## 2015-12-11 ENCOUNTER — Telehealth: Payer: Self-pay | Admitting: *Deleted

## 2015-12-11 ENCOUNTER — Encounter: Payer: Self-pay | Admitting: Hematology and Oncology

## 2015-12-11 ENCOUNTER — Telehealth: Payer: Self-pay | Admitting: Hematology and Oncology

## 2015-12-11 DIAGNOSIS — Z170 Estrogen receptor positive status [ER+]: Secondary | ICD-10-CM | POA: Diagnosis not present

## 2015-12-11 DIAGNOSIS — C50212 Malignant neoplasm of upper-inner quadrant of left female breast: Secondary | ICD-10-CM | POA: Diagnosis not present

## 2015-12-11 NOTE — Telephone Encounter (Signed)
appt made and avs printed °

## 2015-12-11 NOTE — Progress Notes (Signed)
Patient Care Team: Deland Pretty, MD as PCP - General (Internal Medicine) Stark Klein, MD as Consulting Physician (General Surgery) Nicholas Lose, MD as Consulting Physician (Hematology and Oncology) Nicholas Lose, MD as Consulting Physician (Hematology and Oncology)  DIAGNOSIS: Breast cancer of upper-inner quadrant of left female breast [facility])   Staging form: Breast, AJCC 7th Edition   - Clinical stage from 11/20/2015: Stage IA (T1b, N0, M0) - Unsigned  SUMMARY OF ONCOLOGIC HISTORY:   Breast cancer of upper-inner quadrant of left female breast ([place])   11/13/2015 Initial Diagnosis    Left breast biopsy: Invasive ductal carcinoma with ADH, grade 1, ER 90%, PR 90%, Ki-67 3%, HER-2 negative ratio 1.35 copy #2; left breast spiculated mass 8 mm at 9:00 position, axilla negative, T1 BN 0 stage IA clinical stage      12/04/2015 Surgery    Left lumpectomy: IDC with DCIS, 0.8 cm, margins negative, 0/5 lymph nodes negative, ER 90%, PR 90%, HER-2 negative, Ki-67 3%, T1 cN0 stage IA       CHIEF COMPLIANT: Follow-up after recent lumpectomy  INTERVAL HISTORY: Christina Rivera is a 60 year old with above-mentioned history left breast cancer treated with lumpectomy and is here to discuss the pathology report. She is healing very well from the surgery. She has mild discomfort underneath the arm.  REVIEW OF SYSTEMS:   Constitutional: Denies fevers, chills or abnormal weight loss Eyes: Denies blurriness of vision Ears, nose, mouth, throat, and face: Denies mucositis or sore throat Respiratory: Denies cough, dyspnea or wheezes Cardiovascular: Denies palpitation, chest discomfort Gastrointestinal:  Denies nausea, heartburn or change in bowel habits Skin: Denies abnormal skin rashes Lymphatics: Denies new lymphadenopathy or easy bruising Neurological:Denies numbness, tingling or new weaknesses Behavioral/Psych: Mood is stable, no new changes  Extremities: No lower extremity edema Breast: Recent left  lumpectomy All other systems were reviewed with the patient and are negative.  I have reviewed the past medical history, past surgical history, social history and family history with the patient and they are unchanged from previous note.  ALLERGIES:  is allergic to tramadol; doxycycline; and neomycin.  MEDICATIONS:  Current Outpatient Prescriptions  Medication Sig Dispense Refill  . fexofenadine (ALLEGRA) 180 MG tablet Take 180 mg by mouth daily.    Marland Kitchen FINACEA 15 % cream     . oxyCODONE (OXY IR/ROXICODONE) 5 MG immediate release tablet Take 1-2 tablets (5-10 mg total) by mouth every 6 (six) hours as needed for moderate pain, severe pain or breakthrough pain. 20 tablet 0  . Triamcinolone Acetonide (NASACORT ALLERGY 24HR NA) Place into the nose daily.     No current facility-administered medications for this visit.     PHYSICAL EXAMINATION: ECOG PERFORMANCE STATUS: 1 - Symptomatic but completely ambulatory  Vitals:   12/11/15 1048  BP: (!) 151/76  Pulse: 89  Resp: 18  Temp: 98.1 F (36.7 C)   Filed Weights   12/11/15 1048  Weight: 208 lb 3.2 oz (94.4 kg)    GENERAL:alert, no distress and comfortable SKIN: skin color, texture, turgor are normal, no rashes or significant lesions EYES: normal, Conjunctiva are pink and non-injected, sclera clear OROPHARYNX:no exudate, no erythema and lips, buccal mucosa, and tongue normal  NECK: supple, thyroid normal size, non-tender, without nodularity LYMPH:  no palpable lymphadenopathy in the cervical, axillary or inguinal LUNGS: clear to auscultation and percussion with normal breathing effort HEART: regular rate & rhythm and no murmurs and no lower extremity edema ABDOMEN:abdomen soft, non-tender and normal bowel sounds MUSCULOSKELETAL:no cyanosis of  digits and no clubbing  NEURO: alert & oriented x 3 with fluent speech, no focal motor/sensory deficits EXTREMITIES: No lower extremity edema  LABORATORY DATA:  I have reviewed the data as  listed   Chemistry      Component Value Date/Time   NA 141 11/20/2015 1225   K 3.8 11/20/2015 1225   CL 102 09/29/2006 1047   CO2 25 11/20/2015 1225   BUN 17.3 11/20/2015 1225   CREATININE 0.8 11/20/2015 1225      Component Value Date/Time   CALCIUM 9.5 11/20/2015 1225   ALKPHOS 55 11/20/2015 1225   AST 18 11/20/2015 1225   ALT 24 11/20/2015 1225   BILITOT 0.37 11/20/2015 1225       Lab Results  Component Value Date   WBC 9.1 11/20/2015   HGB 14.2 11/20/2015   HCT 43.5 11/20/2015   MCV 88.0 11/20/2015   PLT 291 11/20/2015   NEUTROABS 5.8 11/20/2015     ASSESSMENT & PLAN:  Breast cancer of upper-inner quadrant of left female breast ([place]) Left lumpectomy 12/04/2015: IDC with DCIS, 0.8 cm, margins negative, 0/5 lymph nodes negative, ER 90%, PR 90%, HER-2 negative, Ki-67 3%, T1 cN0 stage IA  Pathology counseling: I discussed the final pathology report of the patient provided  a copy of this report. I discussed the margins as well as lymph node surgeries. We also discussed the final staging along with previously performed ER/PR and HER-2/neu testing.  Recommendation: 1. Oncotype DX testing to determine if chemotherapy would be of any benefit followed by 2. Adjuvant radiation therapy followed by 3. Adjuvant antiestrogen therapy with anastrozole 1 mg daily 5 years  Return to clinic based upon Oncotype DX result We will make radiation appointments if she is low risk. We will make a three-month follow-up appointment at this time.   No orders of the defined types were placed in this encounter.  The patient has a good understanding of the overall plan. she agrees with it. she will call with any problems that may develop before the next visit here.   Rulon Eisenmenger, MD 12/11/15

## 2015-12-11 NOTE — Assessment & Plan Note (Signed)
Left lumpectomy 12/04/2015: IDC with DCIS, 0.8 cm, margins negative, 0/5 lymph nodes negative, ER 90%, PR 90%, HER-2 negative, Ki-67 3%, T1 cN0 stage IA  Pathology counseling: I discussed the final pathology report of the patient provided  a copy of this report. I discussed the margins as well as lymph node surgeries. We also discussed the final staging along with previously performed ER/PR and HER-2/neu testing.  Recommendation: 1. Oncotype DX testing to determine if chemotherapy would be of any benefit followed by 2. Adjuvant radiation therapy followed by 3. Adjuvant antiestrogen therapy with anastrozole 1 mg daily 5 years  Return to clinic based upon Oncotype DX result We will make radiation appointments if she is low risk. We will make a three-month follow-up appointment at this time.

## 2015-12-11 NOTE — Telephone Encounter (Signed)
Received order per Dr. Lindi Adie. Requisition sent to pathology. Received by Varney Biles.

## 2015-12-14 NOTE — Anesthesia Postprocedure Evaluation (Signed)
Anesthesia Post Note  Patient: Christina Rivera  Procedure(s) Performed: Procedure(s) (LRB): BREAST LUMPECTOMY WITH RADIOACTIVE SEED AND SENTINEL LYMPH NODE BIOPSY (Left)  Patient location during evaluation: PACU Anesthesia Type: General Level of consciousness: sedated Pain management: pain level controlled Vital Signs Assessment: post-procedure vital signs reviewed and stable Respiratory status: spontaneous breathing and respiratory function stable Cardiovascular status: stable Anesthetic complications: no     Last Vitals:  Vitals:   12/04/15 1745 12/04/15 1815  BP: (!) 154/88 (!) 156/77  Pulse: 81 79  Resp: 15 18  Temp:  36.9 C    Last Pain:  Vitals:   12/05/15 1010  TempSrc:   PainSc: 1    Pain Goal:                 [place] DANIEL

## 2015-12-24 ENCOUNTER — Telehealth: Payer: Self-pay | Admitting: *Deleted

## 2015-12-24 NOTE — Telephone Encounter (Signed)
Received Oncotype score of 10/7%. Called pt to inform she will not need chemotherapy and her next step is xrt. Informed she will receive a call from Dr. Ida Rogue office to schedule f/u appt. Denies questions or needs at this time. Physician team notified.

## 2015-12-25 ENCOUNTER — Encounter (HOSPITAL_COMMUNITY): Payer: Self-pay

## 2015-12-26 ENCOUNTER — Encounter: Payer: Self-pay | Admitting: Radiation Oncology

## 2015-12-26 NOTE — Progress Notes (Signed)
Location of Breast Cancer:Left Breast  Upper-Inner quadrant  Histology per Pathology Report: Diagnosis7/26/17: Breast, left, needle core biopsy - INVASIVE DUCTAL CARCINOMA.- ATYPICAL DUCTAL HYPERPLASIA.  Receptor Status: ER(90%+), PR (90%+), Her2-neu (nef ), Ki-67(3%)  Did patient present with symptoms (if so, please note symptoms) or was this found on screening mammography?: Routine screening  Past/Anticipated interventions by surgeon, if GYF:VCBSWHQPR 12/04/15: 1. Breast, lumpectomy, Left - INVASIVE AND IN SITU DUCTAL CARCINOMA, 0.8 CM.- CLOSEST MARGIN POSTERIOR AT 0.1 CM.- PREVIOUS BIOPSY SITE.- SEE ONCOLOGY TABLE BELOW. 2. Lymph node, sentinel, biopsy, Left axillary #1 - ONE BENIGN LYMPH NODE (0/1). 3. Lymph node, sentinel, biopsy, Left axillary #2 - ONE BENIGN LYMPH NODE (0/1). 4. Lymph node, sentinel, biopsy, Left axillary #3 - ONE BENIGN LYMPH NODE (0/1). 5. Lymph node, sentinel, biopsy, Left axillary #4 - ONE BENIGN LYMPH NODE (0/1). 6. Lymph node, sentinel, biopsy, Left axillary #5 - ONE BENIGN LYMPH NODE (0/1).  Past/Anticipated interventions by medical oncology, if any: Chemotherapy :Seen in Breast Clinic 11/20/15; 12/11/15  Seen, Oncotype result=10  Anastrozole therapy after radiation  Lymphedema issues, if any:  None   Pain issues, if any: No  SAFETY ISSUES:  Prior radiation? No  Pacemaker/ICD? No  Possible current pregnancy?NO  Is the patient on methotrexate? No  Current Complaints / other details: menarche age 60,  61st live birth age 11,stopped menses 6 years ago,no HRT,used birth control pills for 10 years, has breast implants ,anxiety ,Non smoker, occasional alcohol,no drug use  Father lung cancer, Allergies: Tramadol,Doxycycline,Neomycin BP 140/78 (BP Location: Right Arm, Patient Position: Sitting, Cuff Size: Normal)   Pulse 72   Temp 98.1 F (36.7 C) (Oral)   Resp 18   Ht '5\' 10"'  (1.778 m)   Wt 212 lb 14.4 oz (96.6 kg)   SpO2 100%   BMI 30.55 kg/m      Rebecca Eaton, RN 12/26/2015,10:20 AM

## 2015-12-30 ENCOUNTER — Encounter: Payer: Self-pay | Admitting: Radiation Oncology

## 2015-12-30 ENCOUNTER — Ambulatory Visit
Admission: RE | Admit: 2015-12-30 | Discharge: 2015-12-30 | Disposition: A | Discharge status: Home / Self Care | Payer: 59 | Source: Ambulatory Visit | Attending: Radiation Oncology | Admitting: Radiation Oncology

## 2015-12-30 VITALS — BP 140/78 | HR 72 | Temp 98.1°F | Resp 18 | Ht 70.0 in | Wt 212.9 lb

## 2015-12-30 DIAGNOSIS — C50912 Malignant neoplasm of unspecified site of left female breast: Secondary | ICD-10-CM | POA: Diagnosis present

## 2015-12-30 DIAGNOSIS — E041 Nontoxic single thyroid nodule: Secondary | ICD-10-CM | POA: Insufficient documentation

## 2015-12-30 DIAGNOSIS — Z170 Estrogen receptor positive status [ER+]: Secondary | ICD-10-CM | POA: Diagnosis not present

## 2015-12-30 DIAGNOSIS — F419 Anxiety disorder, unspecified: Secondary | ICD-10-CM | POA: Diagnosis not present

## 2015-12-30 DIAGNOSIS — C50212 Malignant neoplasm of upper-inner quadrant of left female breast: Secondary | ICD-10-CM

## 2015-12-30 DIAGNOSIS — M1990 Unspecified osteoarthritis, unspecified site: Secondary | ICD-10-CM | POA: Insufficient documentation

## 2015-12-30 NOTE — Progress Notes (Signed)
Radiation Oncology         [phone] ________________________________  Name: Christina Rivera MRN: [id number]  Date: 12/30/2015  DOB: [birth year]  XT:KWIOX,BDZHGD DAVIDSON, MD  Nicholas Lose, MD     REFERRING PHYSICIAN: Nicholas Lose, MD   DIAGNOSIS: There were no encounter diagnoses.   HISTORY OF PRESENT ILLNESS:Christina Rivera is a 60 y.o. 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  The patient was found to have a left sided breast cancer after mammography. 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 She was seen in conference on 11/20/15 and met with Dr. Lisbeth Renshaw. At that time he recommended 6 1/2 weeks of daily external radiotherapy to the breast. She has undergone lumpectomy on 12/04/15 with no evidence of spread to the nodes, and her tumor was 72m, consistent with invasive ductal carcinoma and DCIS. Margins were negative, and her oncotype score was 10, without a need for chemotherapy. She comes today to discuss the role of radiotherapy.   PREVIOUS RADIATION THERAPY: No   PAST MEDICAL HISTORY:  has a past medical history of Anxiety; Arthritis; Cancer ([facility] (11/2015); Hot flashes; degenerative disc disease; Multiple thyroid nodules; and Vertigo.     PAST SURGICAL HISTORY: Past Surgical History:  Procedure Laterality Date  . bilateral hammertoe repair    . BREAST LUMPECTOMY WITH RADIOACTIVE SEED AND SENTINEL LYMPH NODE BIOPSY Left 12/04/2015   Procedure: BREAST LUMPECTOMY WITH RADIOACTIVE SEED AND SENTINEL LYMPH NODE BIOPSY;  Surgeon: FStark Klein MD;  Location: [facility]  Service: General;  Laterality: Left;  BREAST LUMPECTOMY WITH RADIOACTIVE SEED AND SENTINEL LYMPH NODE BIOPSY  . LUMBAR MICRODISCECTOMY    . PLACEMENT OF BREAST IMPLANTS       FAMILY HISTORY: family history includes Lung  cancer in her father.   SOCIAL HISTORY:  reports that she has never smoked. She has never used smokeless tobacco. She reports that she drinks alcohol. She reports that she does not use drugs.   ALLERGIES: Tramadol; Doxycycline; and Neomycin   MEDICATIONS:  Current Outpatient Prescriptions  Medication Sig Dispense Refill  . fexofenadine (ALLEGRA) 180 MG tablet Take 180 mg by mouth daily.    .Marland KitchenFINACEA 15 % cream     . Triamcinolone Acetonide (NASACORT ALLERGY 24HR NA) Place into the nose daily.    .Marland KitchenoxyCODONE (OXY IR/ROXICODONE) 5 MG immediate release tablet Take 1-2 tablets (5-10 mg total) by mouth every 6 (six) hours as needed for moderate pain, severe pain or breakthrough pain. (Patient not taking: Reported on 12/30/2015) 20 tablet 0   No current facility-administered medications for this encounter.      REVIEW OF SYSTEMS:  On review of systems, the patient reports that she is doing well overall. She denies any chest pain, shortness of breath, cough, fevers, chills, night sweats, unintended weight changes. She denies any bowel or bladder disturbances, and denies abdominal pain, nausea or vomiting. She denies any new musculoskeletal or joint aches or pains. A complete review of systems is obtained and is otherwise negative.     PHYSICAL EXAM:  height is '5\' 10"'$  (1.778 m) and weight is 212 lb 14.4 oz (96.6 kg). Her oral temperature is 98.1 F (36.7 C). Her blood pressure is 140/78 and her pulse is 72. Her respiration is 18 and oxygen saturation is 100%.   In general this is a well appearing  caucasian female in no acute distress. She's alert and oriented x4 and appropriate throughout the examination. Cardiopulmonary assessment is negative for acute distress and she exhibits normal effort. Her left breast is assessed and steristrips are in place. Minimal ecchymosis is noted inferior to the incision. No cellulitic change, separation, or bleeding is noted.   ECOG = 0  0 - Asymptomatic (Fully  active, able to carry on all predisease activities without restriction)  1 - Symptomatic but completely ambulatory (Restricted in physically strenuous activity but ambulatory and able to carry out work of a light or sedentary nature. For example, light housework, office work)  2 - Symptomatic, <50% in bed during the day (Ambulatory and capable of all self care but unable to carry out any work activities. Up and about more than 50% of waking hours)  3 - Symptomatic, >50% in bed, but not bedbound (Capable of only limited self-care, confined to bed or chair 50% or more of waking hours)  4 - Bedbound (Completely disabled. Cannot carry on any self-care. Totally confined to bed or chair)  5 - Death   Eustace Pen MM, Creech RH, Tormey DC, et al. [phone]). "Toxicity and response criteria of the [facility]". [place] Oncol. 5 (6): 649-55   LABORATORY DATA:  Lab Results  Component Value Date   WBC 9.1 11/20/2015   HGB 14.2 11/20/2015   HCT 43.5 11/20/2015   MCV 88.0 11/20/2015   PLT 291 11/20/2015   Lab Results  Component Value Date   NA 141 11/20/2015   K 3.8 11/20/2015   CL 102 09/29/2006   CO2 25 11/20/2015   Lab Results  Component Value Date   ALT 24 11/20/2015   AST 18 11/20/2015   ALKPHOS 55 11/20/2015   BILITOT 0.37 11/20/2015      RADIOGRAPHY: Nm Sentinel Node Inj-no Rpt (breast)  Result Date: 12/04/2015 CLINICAL DATA: left breast cancer Sulfur colloid was injected intradermally by the nuclear medicine technologist for breast cancer sentinel node localization.       IMPRESSION/PLAN: 1. Stage IA, T1a, N0, M0, invasive ductal carcinoma, ER/PR postive of the left breast. Dr. Lisbeth Renshaw discusses the pathology findings and reviews the nature of invasive breast disease. She has undergone lumpectomy with sentinel mapping and she is ready to proceed with radiation. Dr. Lisbeth Renshaw recommends 33 fractions of external radiotherapy to the left breast followed over 6 1/2  weeks by antiestrogen therapy. We discussed the risks, benefits, short, and long term effects of radiotherapy, particularly with left sided disease, and the patient is interested in proceeding. Dr. Lisbeth Renshaw discusses the delivery and logistics of radiotherapy. She will simulate today. Written consent is obtained.   In a visit lasting 25 minutes, greater than 50% of the time was spent coordinating her care.  The above documentation reflects my direct findings during this shared patient visit. Please see the separate note by Dr. Lisbeth Renshaw on this date for the remainder of the patient's plan of care.     Carola Rhine, PAC

## 2016-01-03 NOTE — Progress Notes (Signed)
  Radiation Oncology         [phone] ________________________________  Name: Christina Rivera MRN: [id number]  Date: 12/30/2015  DOB: April 13, 1956  Optical Surface Tracking Plan:  Since intensity modulated radiotherapy (IMRT) and 3D conformal radiation treatment methods are predicated on accurate and precise positioning for treatment, intrafraction motion monitoring is medically necessary to ensure accurate and safe treatment delivery.  The ability to quantify intrafraction motion without excessive ionizing radiation dose can only be performed with optical surface tracking. Accordingly, surface imaging offers the opportunity to obtain 3D measurements of patient position throughout IMRT and 3D treatments without excessive radiation exposure.  I am ordering optical surface tracking for this patient's upcoming course of radiotherapy. ________________________________  Kyung Rudd, MD 01/03/2016 2:01 PM    Reference:   Ursula Alert, J, et al. Surface imaging-based analysis of intrafraction motion for breast radiotherapy patients.Journal of [place], n. 6, nov. 2014. ISSN [id number].   Available at: <http://www.jacmp.org/index.php/jacmp/article/view/4957>.

## 2016-01-03 NOTE — Progress Notes (Signed)
  Radiation Oncology         [phone] ________________________________  Name: Christina Rivera MRN: [id number]  Date: 12/30/2015  DOB: 11-Oct-1955  DIAGNOSIS:     ICD-9-CM ICD-10-CM   1. Breast cancer of upper-inner quadrant of left female breast ([place]) 174.2 C50.212      SIMULATION AND TREATMENT PLANNING NOTE  The patient presented for simulation prior to beginning her course of radiation treatment for her diagnosis of Left-sided breast cancer. The patient was placed in a supine position on a breast board. A customized vac-lock bag was constructed and this complex treatment device will be used on a daily basis during her treatment. In this fashion, a CT scan was obtained through the chest area and an isocenter was placed near the chest wall within the breast.  The patient will be planned to receive a course of radiation initially to a dose of 50.4 Gy. This will consist of a whole breast radiotherapy technique. To accomplish this, 2 customized blocks have been designed which will correspond to medial and lateral whole breast tangent fields. This treatment will be accomplished at 1.8 Gy per fraction. A forward planning technique will also be evaluated to determine if this approach improves the plan. It is anticipated that the patient will then receive a 10 Gy boost to the seroma cavity which has been contoured. This will be accomplished at 2 Gy per fraction.   This initial treatment will consist of a 3-D conformal technique. The seroma has been contoured as the primary target structure. Additionally, dose volume histograms of both this target as well as the lungs and heart will also be evaluated. Such an approach is necessary to ensure that the target area is adequately covered while the nearby critical  normal structures are adequately spared.  Plan:  The final anticipated total dose therefore will correspond to 60.4 Gy.   Special treatment procedure was performed today due to the extra  time and effort required by myself to plan and prepare this patient for deep inspiration breath hold technique.  I have determined cardiac sparing to be of benefit to this patient to prevent long term cardiac damage due to radiation of the heart.  Bellows were placed on the patient's abdomen. To facilitate cardiac sparing, the patient was coached by the radiation therapists on breath hold techniques and breathing practice was performed. Practice waveforms were obtained. The patient was then scanned while maintaining breath hold in the treatment position.  This image was then transferred over to the imaging specialist. The imaging specialist then created a fusion of the free breathing and breath hold scans using the chest wall as the stable structure. I personally reviewed the fusion in axial, coronal and sagittal image planes.  Excellent cardiac sparing was obtained.  I felt the patient is an appropriate candidate for breath hold and the patient will be treated as such.  The image fusion was then reviewed with the patient to reinforce the necessity of reproducible breath hold.     _______________________________   Jodelle Gross, MD, PhD

## 2016-01-06 ENCOUNTER — Ambulatory Visit
Admission: RE | Admit: 2016-01-06 | Discharge: 2016-01-06 | Disposition: A | Discharge status: Home / Self Care | Payer: 59 | Source: Ambulatory Visit | Attending: Radiation Oncology | Admitting: Radiation Oncology

## 2016-01-06 DIAGNOSIS — C50912 Malignant neoplasm of unspecified site of left female breast: Secondary | ICD-10-CM | POA: Diagnosis not present

## 2016-01-07 ENCOUNTER — Ambulatory Visit: Payer: 59 | Admitting: Radiation Oncology

## 2016-01-07 ENCOUNTER — Ambulatory Visit
Admission: RE | Admit: 2016-01-07 | Discharge: 2016-01-07 | Disposition: A | Discharge status: Home / Self Care | Payer: 59 | Source: Ambulatory Visit | Attending: Radiation Oncology | Admitting: Radiation Oncology

## 2016-01-07 DIAGNOSIS — C50912 Malignant neoplasm of unspecified site of left female breast: Secondary | ICD-10-CM | POA: Diagnosis not present

## 2016-01-08 ENCOUNTER — Ambulatory Visit
Admission: RE | Admit: 2016-01-08 | Discharge: 2016-01-08 | Disposition: A | Discharge status: Home / Self Care | Payer: 59 | Source: Ambulatory Visit | Attending: Radiation Oncology | Admitting: Radiation Oncology

## 2016-01-08 ENCOUNTER — Ambulatory Visit: Admission: RE | Admit: 2016-01-08 | Payer: 59 | Source: Ambulatory Visit

## 2016-01-08 ENCOUNTER — Ambulatory Visit: Payer: 59

## 2016-01-08 DIAGNOSIS — C50912 Malignant neoplasm of unspecified site of left female breast: Secondary | ICD-10-CM | POA: Diagnosis not present

## 2016-01-09 ENCOUNTER — Ambulatory Visit
Admission: RE | Admit: 2016-01-09 | Discharge: 2016-01-09 | Disposition: A | Discharge status: Home / Self Care | Payer: 59 | Source: Ambulatory Visit | Attending: Radiation Oncology | Admitting: Radiation Oncology

## 2016-01-09 ENCOUNTER — Ambulatory Visit: Payer: 59

## 2016-01-09 DIAGNOSIS — C50912 Malignant neoplasm of unspecified site of left female breast: Secondary | ICD-10-CM | POA: Diagnosis not present

## 2016-01-09 DIAGNOSIS — C50212 Malignant neoplasm of upper-inner quadrant of left female breast: Secondary | ICD-10-CM

## 2016-01-09 MED ORDER — RADIAPLEXRX EX GEL
Freq: Once | CUTANEOUS | Status: AC
  Administered 2016-01-09: 10:00:00 via TOPICAL

## 2016-01-09 MED ORDER — ALRA NON-METALLIC DEODORANT (RAD-ONC)
1.0000 "application " | Freq: Once | TOPICAL | Status: AC
  Administered 2016-01-09: 1 via TOPICAL

## 2016-01-09 NOTE — Progress Notes (Signed)
Pt here for patient teaching.  Pt given Radiation and You booklet, skin care instructions, Alra deodorant and Radiaplex gel. Pt reports they have not watched the Radiation Therapy Education video gave the link to watch at home on January 05, 2016.  Reviewed areas of pertinence such as fatigue, hair loss, skin changes, breast tenderness and breast swelling . Pt able to give teach back of to pat skin, use unscented/gentle soap and drink plenty of water,apply Radiaplex bid, avoid applying anything to skin within 4 hours of treatment, avoid wearing an under wire bra and to use an electric razor if they must shave. Pt demonstrated understanding of information given and will contact nursing with any questions or concerns.     Http://rtanswers.org/treatmentinformation/whattoexpect/index

## 2016-01-10 ENCOUNTER — Encounter: Payer: Self-pay | Admitting: Radiation Oncology

## 2016-01-10 ENCOUNTER — Ambulatory Visit
Admission: RE | Admit: 2016-01-10 | Discharge: 2016-01-10 | Disposition: A | Discharge status: Home / Self Care | Payer: 59 | Source: Ambulatory Visit | Attending: Radiation Oncology | Admitting: Radiation Oncology

## 2016-01-10 VITALS — BP 133/78 | HR 84 | Temp 97.9°F | Resp 20 | Wt 208.0 lb

## 2016-01-10 DIAGNOSIS — C50212 Malignant neoplasm of upper-inner quadrant of left female breast: Secondary | ICD-10-CM

## 2016-01-10 DIAGNOSIS — C50912 Malignant neoplasm of unspecified site of left female breast: Secondary | ICD-10-CM | POA: Diagnosis not present

## 2016-01-10 NOTE — Progress Notes (Signed)
Weekly radx ts left breast 4/28 completed, mild if any erythema,  Using radiaplex bid, no pain, feels may be getting some swelling left wrist and elbow, appetite good 1:36 PM BP 133/78 (BP Location: Right Arm, Patient Position: Sitting, Cuff Size: Normal)   Pulse 84   Temp 97.9 F (36.6 C) (Oral)   Resp 20   Wt Readings from Last 3 Encounters:  12/30/15 212 lb 14.4 oz (96.6 kg)  12/11/15 208 lb 3.2 oz (94.4 kg)  12/04/15 208 lb (94.3 kg)

## 2016-01-11 NOTE — Progress Notes (Signed)
   Department of Radiation Oncology  Phone:  [phone] Fax:        [phone]  Weekly Treatment Note    Name: Christina Rivera Date: 01/11/2016 MRN: [id number] DOB: 02/27/1956   Diagnosis:     ICD-9-CM ICD-10-CM   1. Breast cancer of upper-inner quadrant of left female breast ([facility]) 174.2 C50.212      Current dose: 7.2 Gy  Current fraction: 4   MEDICATIONS: Current Outpatient Prescriptions  Medication Sig Dispense Refill  . fexofenadine (ALLEGRA) 180 MG tablet Take 180 mg by mouth daily.    Marland Kitchen FINACEA 15 % cream Apply 1 application topically 2 (two) times daily.     . hyaluronate sodium (RADIAPLEXRX) GEL Apply 1 application topically 2 (two) times daily.    . non-metallic deodorant Jethro Poling) MISC Apply 1 application topically daily as needed.    . Triamcinolone Acetonide (NASACORT ALLERGY 24HR NA) Place into the nose daily.    Marland Kitchen oxyCODONE (OXY IR/ROXICODONE) 5 MG immediate release tablet Take 1-2 tablets (5-10 mg total) by mouth every 6 (six) hours as needed for moderate pain, severe pain or breakthrough pain. (Patient not taking: Reported on 01/10/2016) 20 tablet 0   No current facility-administered medications for this encounter.      ALLERGIES: Tramadol; Doxycycline; and Neomycin   LABORATORY DATA:  Lab Results  Component Value Date   WBC 9.1 11/20/2015   HGB 14.2 11/20/2015   HCT 43.5 11/20/2015   MCV 88.0 11/20/2015   PLT 291 11/20/2015   Lab Results  Component Value Date   NA 141 11/20/2015   K 3.8 11/20/2015   CL 102 09/29/2006   CO2 25 11/20/2015   Lab Results  Component Value Date   ALT 24 11/20/2015   AST 18 11/20/2015   ALKPHOS 55 11/20/2015   BILITOT 0.37 11/20/2015     NARRATIVE: Christina Rivera was seen today for weekly treatment management. The chart was checked and the patient's films were reviewed.  Weekly radx ts left breast 4/28 completed, mild if any erythema,  Using radiaplex bid, no pain, feels may be getting some swelling left  wrist and elbow, appetite good 10:14 AM BP 133/78 (BP Location: Right Arm, Patient Position: Sitting, Cuff Size: Normal)   Pulse 84   Temp 97.9 F (36.6 C) (Oral)   Resp 20   Wt 208 lb (94.3 kg)   BMI 29.84 kg/m   Wt Readings from Last 3 Encounters:  01/10/16 208 lb (94.3 kg)  12/30/15 212 lb 14.4 oz (96.6 kg)  12/11/15 208 lb 3.2 oz (94.4 kg)    PHYSICAL EXAMINATION: weight is 208 lb (94.3 kg). Her oral temperature is 97.9 F (36.6 C). Her blood pressure is 133/78 and her pulse is 84. Her respiration is 20.        ASSESSMENT: The patient is doing satisfactorily with treatment.  PLAN: We will continue with the patient's radiation treatment as planned.

## 2016-01-13 ENCOUNTER — Ambulatory Visit
Admission: RE | Admit: 2016-01-13 | Discharge: 2016-01-13 | Disposition: A | Discharge status: Home / Self Care | Payer: 59 | Source: Ambulatory Visit | Attending: Radiation Oncology | Admitting: Radiation Oncology

## 2016-01-13 DIAGNOSIS — C50912 Malignant neoplasm of unspecified site of left female breast: Secondary | ICD-10-CM | POA: Diagnosis not present

## 2016-01-14 ENCOUNTER — Ambulatory Visit
Admission: RE | Admit: 2016-01-14 | Discharge: 2016-01-14 | Disposition: A | Discharge status: Home / Self Care | Payer: 59 | Source: Ambulatory Visit | Attending: Radiation Oncology | Admitting: Radiation Oncology

## 2016-01-14 DIAGNOSIS — C50912 Malignant neoplasm of unspecified site of left female breast: Secondary | ICD-10-CM | POA: Diagnosis not present

## 2016-01-15 ENCOUNTER — Ambulatory Visit
Admission: RE | Admit: 2016-01-15 | Discharge: 2016-01-15 | Disposition: A | Discharge status: Home / Self Care | Payer: 59 | Source: Ambulatory Visit | Attending: Radiation Oncology | Admitting: Radiation Oncology

## 2016-01-15 DIAGNOSIS — C50912 Malignant neoplasm of unspecified site of left female breast: Secondary | ICD-10-CM | POA: Diagnosis not present

## 2016-01-16 ENCOUNTER — Encounter: Payer: Self-pay | Admitting: General Practice

## 2016-01-16 ENCOUNTER — Ambulatory Visit
Admission: RE | Admit: 2016-01-16 | Discharge: 2016-01-16 | Disposition: A | Discharge status: Home / Self Care | Payer: 59 | Source: Ambulatory Visit | Attending: Radiation Oncology | Admitting: Radiation Oncology

## 2016-01-16 DIAGNOSIS — C50912 Malignant neoplasm of unspecified site of left female breast: Secondary | ICD-10-CM | POA: Diagnosis not present

## 2016-01-16 NOTE — Progress Notes (Signed)
[place] Note  Left VM reminder of [place] team/programming availability with encouragement to return call.   [place], [place], [facility] Pager [phone] Voicemail [phone]

## 2016-01-17 ENCOUNTER — Ambulatory Visit
Admission: RE | Admit: 2016-01-17 | Discharge: 2016-01-17 | Disposition: A | Discharge status: Home / Self Care | Payer: 59 | Source: Ambulatory Visit | Attending: Radiation Oncology | Admitting: Radiation Oncology

## 2016-01-17 ENCOUNTER — Encounter: Payer: Self-pay | Admitting: Radiation Oncology

## 2016-01-17 VITALS — BP 130/79 | HR 68 | Temp 98.2°F | Resp 16 | Wt 209.6 lb

## 2016-01-17 DIAGNOSIS — C50912 Malignant neoplasm of unspecified site of left female breast: Secondary | ICD-10-CM | POA: Diagnosis not present

## 2016-01-17 DIAGNOSIS — C50212 Malignant neoplasm of upper-inner quadrant of left female breast: Secondary | ICD-10-CM

## 2016-01-17 NOTE — Progress Notes (Signed)
Weekly rad txs left breast , 9/28  Mild erythema, skin intact, using radiaplex bid,appetite good, late afternoon a little slump, walking helps stated, no pain 10:50 AM BP 130/79 (BP Location: Right Arm, Patient Position: Sitting, Cuff Size: Normal)   Pulse 68   Temp 98.2 F (36.8 C) (Oral)   Resp 16   Wt 209 lb 9.6 oz (95.1 kg)   BMI 30.07 kg/m   Wt Readings from Last 3 Encounters:  01/17/16 209 lb 9.6 oz (95.1 kg)  01/10/16 208 lb (94.3 kg)  12/30/15 212 lb 14.4 oz (96.6 kg)

## 2016-01-17 NOTE — Progress Notes (Signed)
   Department of Radiation Oncology  Phone:  [phone] Fax:        [phone]  Weekly Treatment Note    Name: Christina Rivera Date: 01/17/2016 MRN: [id number] DOB: Aug 08, 1955   Diagnosis:     ICD-9-CM ICD-10-CM   1. Breast cancer of upper-inner quadrant of left female breast ([facility]) 174.2 C50.212      Current dose: 16.2 Gy  Current fraction: 9   MEDICATIONS: Current Outpatient Prescriptions  Medication Sig Dispense Refill  . fexofenadine (ALLEGRA) 180 MG tablet Take 180 mg by mouth daily.    Marland Kitchen FINACEA 15 % cream Apply 1 application topically 2 (two) times daily.     . hyaluronate sodium (RADIAPLEXRX) GEL Apply 1 application topically 2 (two) times daily.    . non-metallic deodorant Jethro Poling) MISC Apply 1 application topically daily as needed.    Marland Kitchen oxyCODONE (OXY IR/ROXICODONE) 5 MG immediate release tablet Take 1-2 tablets (5-10 mg total) by mouth every 6 (six) hours as needed for moderate pain, severe pain or breakthrough pain. 20 tablet 0  . Triamcinolone Acetonide (NASACORT ALLERGY 24HR NA) Place into the nose daily.     No current facility-administered medications for this encounter.      ALLERGIES: Tramadol; Doxycycline; and Neomycin   LABORATORY DATA:  Lab Results  Component Value Date   WBC 9.1 11/20/2015   HGB 14.2 11/20/2015   HCT 43.5 11/20/2015   MCV 88.0 11/20/2015   PLT 291 11/20/2015   Lab Results  Component Value Date   NA 141 11/20/2015   K 3.8 11/20/2015   CL 102 09/29/2006   CO2 25 11/20/2015   Lab Results  Component Value Date   ALT 24 11/20/2015   AST 18 11/20/2015   ALKPHOS 55 11/20/2015   BILITOT 0.37 11/20/2015     NARRATIVE: Christina Rivera was seen today for weekly treatment management. The chart was checked and the patient's films were reviewed.  Weekly rad txs left breast , 9/28  Mild erythema, skin intact, using radiaplex bid,appetite good, late afternoon a little slump, walking helps stated, no pain 11:11 AM BP  130/79 (BP Location: Right Arm, Patient Position: Sitting, Cuff Size: Normal)   Pulse 68   Temp 98.2 F (36.8 C) (Oral)   Resp 16   Wt 209 lb 9.6 oz (95.1 kg)   BMI 30.07 kg/m   Wt Readings from Last 3 Encounters:  01/17/16 209 lb 9.6 oz (95.1 kg)  01/10/16 208 lb (94.3 kg)  12/30/15 212 lb 14.4 oz (96.6 kg)    PHYSICAL EXAMINATION: weight is 209 lb 9.6 oz (95.1 kg). Her oral temperature is 98.2 F (36.8 C). Her blood pressure is 130/79 and her pulse is 68. Her respiration is 16.        ASSESSMENT: The patient is doing satisfactorily with treatment.  PLAN: We will continue with the patient's radiation treatment as planned.

## 2016-01-20 ENCOUNTER — Ambulatory Visit
Admission: RE | Admit: 2016-01-20 | Discharge: 2016-01-20 | Disposition: A | Discharge status: Home / Self Care | Payer: 59 | Source: Ambulatory Visit | Attending: Radiation Oncology | Admitting: Radiation Oncology

## 2016-01-20 DIAGNOSIS — C50912 Malignant neoplasm of unspecified site of left female breast: Secondary | ICD-10-CM | POA: Diagnosis not present

## 2016-01-21 ENCOUNTER — Ambulatory Visit
Admission: RE | Admit: 2016-01-21 | Discharge: 2016-01-21 | Disposition: A | Discharge status: Home / Self Care | Payer: 59 | Source: Ambulatory Visit | Attending: Radiation Oncology | Admitting: Radiation Oncology

## 2016-01-21 DIAGNOSIS — C50912 Malignant neoplasm of unspecified site of left female breast: Secondary | ICD-10-CM | POA: Diagnosis not present

## 2016-01-22 ENCOUNTER — Ambulatory Visit
Admission: RE | Admit: 2016-01-22 | Discharge: 2016-01-22 | Disposition: A | Discharge status: Home / Self Care | Payer: 59 | Source: Ambulatory Visit | Attending: Radiation Oncology | Admitting: Radiation Oncology

## 2016-01-22 DIAGNOSIS — C50912 Malignant neoplasm of unspecified site of left female breast: Secondary | ICD-10-CM | POA: Diagnosis not present

## 2016-01-23 ENCOUNTER — Ambulatory Visit
Admission: RE | Admit: 2016-01-23 | Discharge: 2016-01-23 | Disposition: A | Discharge status: Home / Self Care | Payer: 59 | Source: Ambulatory Visit | Attending: Radiation Oncology | Admitting: Radiation Oncology

## 2016-01-23 ENCOUNTER — Encounter: Payer: Self-pay | Admitting: Radiation Oncology

## 2016-01-23 VITALS — BP 117/74 | HR 72 | Temp 98.4°F | Ht 70.0 in | Wt 208.0 lb

## 2016-01-23 DIAGNOSIS — C50912 Malignant neoplasm of unspecified site of left female breast: Secondary | ICD-10-CM | POA: Diagnosis not present

## 2016-01-23 DIAGNOSIS — C50212 Malignant neoplasm of upper-inner quadrant of left female breast: Secondary | ICD-10-CM

## 2016-01-23 DIAGNOSIS — Z170 Estrogen receptor positive status [ER+]: Principal | ICD-10-CM

## 2016-01-23 NOTE — Progress Notes (Signed)
Ms. Dukart presents for her 13th fraction of radiation to her Left Breast. She denies pain or fatigue. Her Left Breast is slightly red, tender over her nipple, and swollen. She does report general itching to the Left Breast especially below her Left Breast. She is using the Radiaplex cream twice daily to her Left Breast.   BP 117/74   Pulse 72   Temp 98.4 F (36.9 C)   Ht 5\' 10"  (1.778 m)   Wt 208 lb (94.3 kg)   SpO2 100% Comment: room air  BMI 29.84 kg/m    Wt Readings from Last 3 Encounters:  01/23/16 208 lb (94.3 kg)  01/17/16 209 lb 9.6 oz (95.1 kg)  01/10/16 208 lb (94.3 kg)

## 2016-01-23 NOTE — Progress Notes (Signed)
   Department of Radiation Oncology  Phone:  [phone] Fax:        [phone]  Weekly Treatment Note    Name: Christina Rivera Date: 01/23/2016 MRN: [id number] DOB: 1955/05/11   Diagnosis:     ICD-9-CM ICD-10-CM   1. Malignant neoplasm of upper-inner quadrant of left breast in female, estrogen receptor positive ([place]) 174.2 C50.212    V86.0 Z17.0      Current dose: 23.4 Gy  Current fraction: 13   MEDICATIONS: Current Outpatient Prescriptions  Medication Sig Dispense Refill  . fexofenadine (ALLEGRA) 180 MG tablet Take 180 mg by mouth daily.    Marland Kitchen FINACEA 15 % cream Apply 1 application topically 2 (two) times daily.     . hyaluronate sodium (RADIAPLEXRX) GEL Apply 1 application topically 2 (two) times daily.    . non-metallic deodorant Jethro Poling) MISC Apply 1 application topically daily as needed.    . Triamcinolone Acetonide (NASACORT ALLERGY 24HR NA) Place into the nose daily.    Marland Kitchen oxyCODONE (OXY IR/ROXICODONE) 5 MG immediate release tablet Take 1-2 tablets (5-10 mg total) by mouth every 6 (six) hours as needed for moderate pain, severe pain or breakthrough pain. (Patient not taking: Reported on 01/23/2016) 20 tablet 0   No current facility-administered medications for this encounter.      ALLERGIES: Tramadol; Doxycycline; and Neomycin   LABORATORY DATA:  Lab Results  Component Value Date   WBC 9.1 11/20/2015   HGB 14.2 11/20/2015   HCT 43.5 11/20/2015   MCV 88.0 11/20/2015   PLT 291 11/20/2015   Lab Results  Component Value Date   NA 141 11/20/2015   K 3.8 11/20/2015   CL 102 09/29/2006   CO2 25 11/20/2015   Lab Results  Component Value Date   ALT 24 11/20/2015   AST 18 11/20/2015   ALKPHOS 55 11/20/2015   BILITOT 0.37 11/20/2015     NARRATIVE: Christina Rivera was seen today for weekly treatment management. The chart was checked and the patient's films were reviewed.  Christina Rivera presents for her 13th fraction of radiation to her Left Breast.  She denies pain or fatigue. Her Left Breast is slightly red, tender over her nipple, and swollen. She does report general itching to the Left Breast especially below her Left Breast. She is using the Radiaplex cream twice daily to her Left Breast.   BP 117/74   Pulse 72   Temp 98.4 F (36.9 C)   Ht 5\' 10"  (1.778 m)   Wt 208 lb (94.3 kg)   SpO2 100% Comment: room air  BMI 29.84 kg/m    Wt Readings from Last 3 Encounters:  01/23/16 208 lb (94.3 kg)  01/17/16 209 lb 9.6 oz (95.1 kg)  01/10/16 208 lb (94.3 kg)    PHYSICAL EXAMINATION: height is 5\' 10"  (1.778 m) and weight is 208 lb (94.3 kg). Her temperature is 98.4 F (36.9 C). Her blood pressure is 117/74 and her pulse is 72. Her oxygen saturation is 100%.      Mild radiation effect currently with slight erythema  ASSESSMENT: The patient is doing satisfactorily with treatment.  PLAN: We will continue with the patient's radiation treatment as planned.

## 2016-01-24 ENCOUNTER — Ambulatory Visit
Admission: RE | Admit: 2016-01-24 | Discharge: 2016-01-24 | Disposition: A | Discharge status: Home / Self Care | Payer: 59 | Source: Ambulatory Visit | Attending: Radiation Oncology | Admitting: Radiation Oncology

## 2016-01-24 DIAGNOSIS — C50912 Malignant neoplasm of unspecified site of left female breast: Secondary | ICD-10-CM | POA: Diagnosis not present

## 2016-01-27 ENCOUNTER — Ambulatory Visit
Admission: RE | Admit: 2016-01-27 | Discharge: 2016-01-27 | Disposition: A | Discharge status: Home / Self Care | Payer: 59 | Source: Ambulatory Visit | Attending: Radiation Oncology | Admitting: Radiation Oncology

## 2016-01-27 DIAGNOSIS — C50912 Malignant neoplasm of unspecified site of left female breast: Secondary | ICD-10-CM | POA: Diagnosis not present

## 2016-01-27 NOTE — Progress Notes (Signed)
DOS 01.10.2017 Hammertoe with Screw Fixation 2nd Toe Both Feet

## 2016-01-28 ENCOUNTER — Ambulatory Visit
Admission: RE | Admit: 2016-01-28 | Discharge: 2016-01-28 | Disposition: A | Discharge status: Home / Self Care | Payer: 59 | Source: Ambulatory Visit | Attending: Radiation Oncology | Admitting: Radiation Oncology

## 2016-01-28 DIAGNOSIS — C50912 Malignant neoplasm of unspecified site of left female breast: Secondary | ICD-10-CM | POA: Diagnosis not present

## 2016-01-29 ENCOUNTER — Ambulatory Visit
Admission: RE | Admit: 2016-01-29 | Discharge: 2016-01-29 | Disposition: A | Discharge status: Home / Self Care | Payer: 59 | Source: Ambulatory Visit | Attending: Radiation Oncology | Admitting: Radiation Oncology

## 2016-01-29 ENCOUNTER — Encounter: Payer: Self-pay | Admitting: Radiation Oncology

## 2016-01-29 DIAGNOSIS — C50912 Malignant neoplasm of unspecified site of left female breast: Secondary | ICD-10-CM | POA: Diagnosis not present

## 2016-01-30 ENCOUNTER — Encounter: Payer: Self-pay | Admitting: Radiation Oncology

## 2016-01-30 ENCOUNTER — Ambulatory Visit
Admission: RE | Admit: 2016-01-30 | Discharge: 2016-01-30 | Disposition: A | Discharge status: Home / Self Care | Payer: 59 | Source: Ambulatory Visit | Attending: Radiation Oncology | Admitting: Radiation Oncology

## 2016-01-30 VITALS — BP 132/80 | HR 68 | Temp 98.3°F | Resp 18 | Ht 70.0 in | Wt 207.6 lb

## 2016-01-30 DIAGNOSIS — C50212 Malignant neoplasm of upper-inner quadrant of left female breast: Secondary | ICD-10-CM | POA: Insufficient documentation

## 2016-01-30 DIAGNOSIS — C50912 Malignant neoplasm of unspecified site of left female breast: Secondary | ICD-10-CM | POA: Diagnosis not present

## 2016-01-30 MED ORDER — RADIAPLEXRX EX GEL
Freq: Once | CUTANEOUS | Status: AC
  Administered 2016-01-30: 11:00:00 via TOPICAL

## 2016-01-30 NOTE — Progress Notes (Signed)
   Department of Radiation Oncology  Phone:  [phone] Fax:        [phone]  Weekly Treatment Note    Name: Christina Rivera Date: 01/30/2016 MRN: [id number] DOB: [birth year]   Diagnosis:     ICD-9-CM ICD-10-CM   1. Malignant neoplasm of upper-inner quadrant of left female breast, unspecified estrogen receptor status (HCC) 174.2 C50.212 hyaluronate sodium (RADIAPLEXRX) gel     Current dose: 32.4 Gy  Current fraction:18   MEDICATIONS: Current Outpatient Prescriptions  Medication Sig Dispense Refill  . fexofenadine (ALLEGRA) 180 MG tablet Take 180 mg by mouth daily.    Marland Kitchen FINACEA 15 % cream Apply 1 application topically 2 (two) times daily.     . hyaluronate sodium (RADIAPLEXRX) GEL Apply 1 application topically 2 (two) times daily.    . non-metallic deodorant Jethro Poling) MISC Apply 1 application topically daily as needed.    . Triamcinolone Acetonide (NASACORT ALLERGY 24HR NA) Place into the nose daily.    Marland Kitchen HYDROcodone-acetaminophen (NORCO) 10-325 MG tablet Take 1 tablet by mouth every 4 (four) hours as needed.    Marland Kitchen oxyCODONE (OXY IR/ROXICODONE) 5 MG immediate release tablet Take 1-2 tablets (5-10 mg total) by mouth every 6 (six) hours as needed for moderate pain, severe pain or breakthrough pain. (Patient not taking: Reported on 01/30/2016) 20 tablet 0   No current facility-administered medications for this encounter.      ALLERGIES: Tramadol; Doxycycline; and Neomycin   LABORATORY DATA:  Lab Results  Component Value Date   WBC 9.1 11/20/2015   HGB 14.2 11/20/2015   HCT 43.5 11/20/2015   MCV 88.0 11/20/2015   PLT 291 11/20/2015   Lab Results  Component Value Date   NA 141 11/20/2015   K 3.8 11/20/2015   CL 102 09/29/2006   CO2 25 11/20/2015   Lab Results  Component Value Date   ALT 24 11/20/2015   AST 18 11/20/2015   ALKPHOS 55 11/20/2015   BILITOT 0.37 11/20/2015     NARRATIVE: Eliette J Coderre was seen today for weekly treatment management. The  chart was checked and the patient's films were reviewed.  Ms. Dewbre presents for her 18th fraction of radiation to her Left Breast. She denies pain or fatigue. Her Left Breast is  red, tender over her nipple, and swollen. She does report general itching to the Left Breast especially below her Left Breast. She is using the Radiaplex cream twice daily to her Left Breast.  New tube of Radiaplex gel given. Wt Readings from Last 3 Encounters:  01/30/16 207 lb 9.6 oz (94.2 kg)  01/23/16 208 lb (94.3 kg)  01/17/16 209 lb 9.6 oz (95.1 kg)  BP 132/80 (BP Location: Right Arm, Patient Position: Sitting, Cuff Size: Normal)   Pulse 68   Temp 98.3 F (36.8 C) (Oral)   Resp 18   Ht 5\' 10"  (1.778 m)   Wt 207 lb 9.6 oz (94.2 kg)   SpO2 98%   BMI 29.79 kg/m   PHYSICAL EXAMINATION: height is 5\' 10"  (1.778 m) and weight is 207 lb 9.6 oz (94.2 kg). Her oral temperature is 98.3 F (36.8 C). Her blood pressure is 132/80 and her pulse is 68. Her respiration is 18 and oxygen saturation is 98%.        ASSESSMENT: The patient is doing satisfactorily with treatment.  PLAN: We will continue with the patient's radiation treatment as planned.

## 2016-01-30 NOTE — Progress Notes (Addendum)
Ms. Higham presents for her 18th fraction of radiation to her Left Breast. She denies pain or fatigue. Her Left Breast is  red, tender over her nipple, and swollen. She does report general itching to the Left Breast especially below her Left Breast. She is using the Radiaplex cream twice daily to her Left Breast.  New tube of Radiaplex gel given. Wt Readings from Last 3 Encounters:  01/30/16 207 lb 9.6 oz (94.2 kg)  01/23/16 208 lb (94.3 kg)  01/17/16 209 lb 9.6 oz (95.1 kg)  BP 132/80 (BP Location: Right Arm, Patient Position: Sitting, Cuff Size: Normal)   Pulse 68   Temp 98.3 F (36.8 C) (Oral)   Resp 18   Ht 5\' 10"  (1.778 m)   Wt 207 lb 9.6 oz (94.2 kg)   SpO2 98%   BMI 29.79 kg/m

## 2016-01-31 ENCOUNTER — Ambulatory Visit
Admission: RE | Admit: 2016-01-31 | Discharge: 2016-01-31 | Disposition: A | Discharge status: Home / Self Care | Payer: 59 | Source: Ambulatory Visit | Attending: Radiation Oncology | Admitting: Radiation Oncology

## 2016-01-31 DIAGNOSIS — C50912 Malignant neoplasm of unspecified site of left female breast: Secondary | ICD-10-CM | POA: Diagnosis not present

## 2016-02-03 ENCOUNTER — Ambulatory Visit
Admission: RE | Admit: 2016-02-03 | Discharge: 2016-02-03 | Disposition: A | Discharge status: Home / Self Care | Payer: 59 | Source: Ambulatory Visit | Attending: Radiation Oncology | Admitting: Radiation Oncology

## 2016-02-03 DIAGNOSIS — C50912 Malignant neoplasm of unspecified site of left female breast: Secondary | ICD-10-CM | POA: Diagnosis not present

## 2016-02-04 ENCOUNTER — Ambulatory Visit
Admission: RE | Admit: 2016-02-04 | Discharge: 2016-02-04 | Disposition: A | Discharge status: Home / Self Care | Payer: 59 | Source: Ambulatory Visit | Attending: Radiation Oncology | Admitting: Radiation Oncology

## 2016-02-04 DIAGNOSIS — C50912 Malignant neoplasm of unspecified site of left female breast: Secondary | ICD-10-CM | POA: Diagnosis not present

## 2016-02-05 ENCOUNTER — Ambulatory Visit: Admission: RE | Admit: 2016-02-05 | Payer: 59 | Source: Ambulatory Visit | Admitting: Radiation Oncology

## 2016-02-05 ENCOUNTER — Ambulatory Visit
Admission: RE | Admit: 2016-02-05 | Discharge: 2016-02-05 | Disposition: A | Discharge status: Home / Self Care | Payer: 59 | Source: Ambulatory Visit | Attending: Radiation Oncology | Admitting: Radiation Oncology

## 2016-02-05 ENCOUNTER — Encounter: Payer: Self-pay | Admitting: Radiation Oncology

## 2016-02-05 VITALS — BP 127/77 | HR 68 | Temp 98.9°F | Resp 16 | Wt 208.0 lb

## 2016-02-05 DIAGNOSIS — Z170 Estrogen receptor positive status [ER+]: Principal | ICD-10-CM

## 2016-02-05 DIAGNOSIS — C50212 Malignant neoplasm of upper-inner quadrant of left female breast: Secondary | ICD-10-CM

## 2016-02-05 DIAGNOSIS — C50912 Malignant neoplasm of unspecified site of left female breast: Secondary | ICD-10-CM | POA: Diagnosis not present

## 2016-02-05 NOTE — Progress Notes (Signed)
Weekly radtx s left breast,22/33 completed,  raw looking under left axlla, mild   erythema  breast,  Uses radaiplex bid, appetite good, is getting fatigued,  10:46 AM BP 127/77 (BP Location: Left Arm, Patient Position: Sitting, Cuff Size: Normal)   Pulse 68   Temp 98.9 F (37.2 C) (Oral)   Resp 16   Wt 208 lb 9.6 oz (94.6 kg)   BMI 29.93 kg/m      Wt Readings from Last 3 Encounters:  02/05/16 208 lb 9.6 oz (94.6 kg)  01/30/16 207 lb 9.6 oz (94.2 kg)  01/23/16 208 lb (94.3 kg)

## 2016-02-05 NOTE — Progress Notes (Signed)
Weekly radtx s left breast,22/33 completed,  raw looking under left axlla, mild   erythema  breast,  Uses radaiplex bid, appetite good, is getting fatigued,  10:46 AM BP 127/77 (BP Location: Left Arm, Patient Position: Sitting, Cuff Size: Normal)   Pulse 68   Temp 98.9 F (37.2 C) (Oral)   Resp 16   Wt 208 lb 9.6 oz (94.6 kg)   BMI 29.93 kg/m   Wt Readings from Last 3 Encounters:  02/05/16 208 lb 9.6 oz (94.6 kg)  01/30/16 207 lb 9.6 oz (94.2 kg)  01/23/16 208 lb (94.3 kg)

## 2016-02-05 NOTE — Progress Notes (Signed)
   Department of Radiation Oncology  Phone:  [phone] Fax:        [phone]  Weekly Treatment Note    Name: Christina Rivera Date: 02/05/2016 MRN: [id number] DOB: 03/06/1956   Diagnosis:     ICD-9-CM ICD-10-CM   1. Malignant neoplasm of upper-inner quadrant of left breast in female, estrogen receptor positive ([place]) 174.2 C50.212    V86.0 Z17.0      Current dose: 39.6 Gy  Current fraction: 22   MEDICATIONS: Current Outpatient Prescriptions  Medication Sig Dispense Refill  . fexofenadine (ALLEGRA) 180 MG tablet Take 180 mg by mouth daily.    Marland Kitchen FINACEA 15 % cream Apply 1 application topically 2 (two) times daily.     . hyaluronate sodium (RADIAPLEXRX) GEL Apply 1 application topically 2 (two) times daily.    Marland Kitchen HYDROcodone-acetaminophen (NORCO) 10-325 MG tablet Take 1 tablet by mouth every 4 (four) hours as needed.    . non-metallic deodorant Jethro Poling) MISC Apply 1 application topically daily as needed.    . Triamcinolone Acetonide (NASACORT ALLERGY 24HR NA) Place into the nose daily.    Marland Kitchen oxyCODONE (OXY IR/ROXICODONE) 5 MG immediate release tablet Take 1-2 tablets (5-10 mg total) by mouth every 6 (six) hours as needed for moderate pain, severe pain or breakthrough pain. (Patient not taking: Reported on 02/05/2016) 20 tablet 0   No current facility-administered medications for this encounter.      ALLERGIES: Tramadol; Doxycycline; Neomycin; and Tape   LABORATORY DATA:  Lab Results  Component Value Date   WBC 9.1 11/20/2015   HGB 14.2 11/20/2015   HCT 43.5 11/20/2015   MCV 88.0 11/20/2015   PLT 291 11/20/2015   Lab Results  Component Value Date   NA 141 11/20/2015   K 3.8 11/20/2015   CL 102 09/29/2006   CO2 25 11/20/2015   Lab Results  Component Value Date   ALT 24 11/20/2015   AST 18 11/20/2015   ALKPHOS 55 11/20/2015   BILITOT 0.37 11/20/2015     NARRATIVE: Christina Rivera was seen today for weekly treatment management. The chart was checked and  the patient's films were reviewed.  Weekly radtxt left breast,22/33 completed. Uses radaiplex bid, appetite good, and is getting fatigued.  Wt Readings from Last 3 Encounters:  02/05/16 208 lb (94.3 kg)  02/05/16 208 lb 9.6 oz (94.6 kg)  01/30/16 207 lb 9.6 oz (94.2 kg)  BP 127/77 (BP Location: Left Arm, Patient Position: Sitting, Cuff Size: Normal)   Pulse 68   Temp 98.9 F (37.2 C) (Oral)   Resp 16   Wt 208 lb (94.3 kg)   BMI 29.84 kg/m   PHYSICAL EXAMINATION: weight is 208 lb (94.3 kg). Her oral temperature is 98.9 F (37.2 C). Her blood pressure is 127/77 and her pulse is 68. Her respiration is 16.   Mild erythema in the treatment area without desquamation.  ASSESSMENT: The patient is doing satisfactorily with treatment.  PLAN: We will continue with the patient's radiation treatment as planned.   ------------------------------------------------  Jodelle Gross, MD, PhD  This document serves as a record of services personally performed by Kyung Rudd, MD. It was created on his behalf by Darcus Austin, a trained medical scribe. The creation of this record is based on the scribe's personal observations and the provider's statements to them. This document has been checked and approved by the attending provider.

## 2016-02-06 ENCOUNTER — Ambulatory Visit: Payer: 59 | Admitting: Radiation Oncology

## 2016-02-06 ENCOUNTER — Ambulatory Visit
Admission: RE | Admit: 2016-02-06 | Discharge: 2016-02-06 | Disposition: A | Discharge status: Home / Self Care | Payer: 59 | Source: Ambulatory Visit | Attending: Radiation Oncology | Admitting: Radiation Oncology

## 2016-02-06 DIAGNOSIS — C50912 Malignant neoplasm of unspecified site of left female breast: Secondary | ICD-10-CM | POA: Diagnosis not present

## 2016-02-07 ENCOUNTER — Ambulatory Visit
Admission: RE | Admit: 2016-02-07 | Discharge: 2016-02-07 | Disposition: A | Discharge status: Home / Self Care | Payer: 59 | Source: Ambulatory Visit | Attending: Radiation Oncology | Admitting: Radiation Oncology

## 2016-02-07 ENCOUNTER — Encounter: Payer: Self-pay | Admitting: Radiation Oncology

## 2016-02-07 ENCOUNTER — Ambulatory Visit: Payer: 59 | Admitting: Radiation Oncology

## 2016-02-07 ENCOUNTER — Inpatient Hospital Stay: Admission: RE | Admit: 2016-02-07 | Payer: 59 | Source: Ambulatory Visit | Admitting: Radiation Oncology

## 2016-02-07 DIAGNOSIS — C50912 Malignant neoplasm of unspecified site of left female breast: Secondary | ICD-10-CM | POA: Diagnosis not present

## 2016-02-10 ENCOUNTER — Ambulatory Visit
Admission: RE | Admit: 2016-02-10 | Discharge: 2016-02-10 | Disposition: A | Discharge status: Home / Self Care | Payer: 59 | Source: Ambulatory Visit | Attending: Radiation Oncology | Admitting: Radiation Oncology

## 2016-02-10 DIAGNOSIS — C50912 Malignant neoplasm of unspecified site of left female breast: Secondary | ICD-10-CM | POA: Diagnosis not present

## 2016-02-11 ENCOUNTER — Ambulatory Visit
Admission: RE | Admit: 2016-02-11 | Discharge: 2016-02-11 | Disposition: A | Discharge status: Home / Self Care | Payer: 59 | Source: Ambulatory Visit | Attending: Radiation Oncology | Admitting: Radiation Oncology

## 2016-02-11 DIAGNOSIS — C50912 Malignant neoplasm of unspecified site of left female breast: Secondary | ICD-10-CM | POA: Diagnosis not present

## 2016-02-12 ENCOUNTER — Encounter: Payer: Self-pay | Admitting: Radiation Oncology

## 2016-02-12 ENCOUNTER — Ambulatory Visit
Admission: RE | Admit: 2016-02-12 | Discharge: 2016-02-12 | Disposition: A | Discharge status: Home / Self Care | Payer: 59 | Source: Ambulatory Visit | Attending: Radiation Oncology | Admitting: Radiation Oncology

## 2016-02-12 DIAGNOSIS — C50912 Malignant neoplasm of unspecified site of left female breast: Secondary | ICD-10-CM | POA: Diagnosis not present

## 2016-02-13 ENCOUNTER — Ambulatory Visit
Admission: RE | Admit: 2016-02-13 | Discharge: 2016-02-13 | Disposition: A | Discharge status: Home / Self Care | Payer: 59 | Source: Ambulatory Visit | Attending: Radiation Oncology | Admitting: Radiation Oncology

## 2016-02-13 DIAGNOSIS — C50912 Malignant neoplasm of unspecified site of left female breast: Secondary | ICD-10-CM | POA: Diagnosis not present

## 2016-02-14 ENCOUNTER — Ambulatory Visit
Admission: RE | Admit: 2016-02-14 | Discharge: 2016-02-14 | Disposition: A | Discharge status: Home / Self Care | Payer: 59 | Source: Ambulatory Visit | Attending: Radiation Oncology | Admitting: Radiation Oncology

## 2016-02-14 ENCOUNTER — Inpatient Hospital Stay
Admission: RE | Admit: 2016-02-14 | Discharge: 2016-02-14 | Disposition: A | Discharge status: Home / Self Care | Payer: Self-pay | Source: Ambulatory Visit | Attending: Radiation Oncology | Admitting: Radiation Oncology

## 2016-02-14 DIAGNOSIS — C50212 Malignant neoplasm of upper-inner quadrant of left female breast: Secondary | ICD-10-CM

## 2016-02-14 DIAGNOSIS — Z170 Estrogen receptor positive status [ER+]: Principal | ICD-10-CM

## 2016-02-14 DIAGNOSIS — C50912 Malignant neoplasm of unspecified site of left female breast: Secondary | ICD-10-CM | POA: Diagnosis not present

## 2016-02-14 NOTE — Progress Notes (Signed)
   Department of Radiation Oncology  Phone:  [phone] Fax:        [phone]  Weekly Treatment Note    Name: Christina Rivera Date: 02/14/2016 MRN: [id number] DOB: 16-Apr-1956   Diagnosis:     ICD-9-CM ICD-10-CM   1. Malignant neoplasm of upper-inner quadrant of left breast in female, estrogen receptor positive ([place]) 174.2 C50.212    V86.0 Z17.0      Current dose: 52.4 Gy  Current fraction:29   MEDICATIONS: Current Outpatient Prescriptions  Medication Sig Dispense Refill  . fexofenadine (ALLEGRA) 180 MG tablet Take 180 mg by mouth daily.    Marland Kitchen FINACEA 15 % cream Apply 1 application topically 2 (two) times daily.     . hyaluronate sodium (RADIAPLEXRX) GEL Apply 1 application topically 2 (two) times daily.    Marland Kitchen HYDROcodone-acetaminophen (NORCO) 10-325 MG tablet Take 1 tablet by mouth every 4 (four) hours as needed.    . non-metallic deodorant Jethro Poling) MISC Apply 1 application topically daily as needed.    Marland Kitchen oxyCODONE (OXY IR/ROXICODONE) 5 MG immediate release tablet Take 1-2 tablets (5-10 mg total) by mouth every 6 (six) hours as needed for moderate pain, severe pain or breakthrough pain. (Patient not taking: Reported on 02/05/2016) 20 tablet 0  . Triamcinolone Acetonide (NASACORT ALLERGY 24HR NA) Place into the nose daily.     No current facility-administered medications for this encounter.      ALLERGIES: Tramadol; Doxycycline; Neomycin; and Tape   LABORATORY DATA:  Lab Results  Component Value Date   WBC 9.1 11/20/2015   HGB 14.2 11/20/2015   HCT 43.5 11/20/2015   MCV 88.0 11/20/2015   PLT 291 11/20/2015   Lab Results  Component Value Date   NA 141 11/20/2015   K 3.8 11/20/2015   CL 102 09/29/2006   CO2 25 11/20/2015   Lab Results  Component Value Date   ALT 24 11/20/2015   AST 18 11/20/2015   ALKPHOS 55 11/20/2015   BILITOT 0.37 11/20/2015     NARRATIVE: Christina Rivera was seen today for weekly treatment management. The chart was checked and  the patient's films were reviewed.  The patient is doing well. No significant change in skin irritation. The patient begins her boost treatment today.  PHYSICAL EXAMINATION: vitals were not taken for this visit.  Some hyperpigmentation underneath the left breast with some diffuse erythema in the treatment area.  ASSESSMENT: The patient is doing satisfactorily with treatment.  PLAN: We will continue with the patient's radiation treatment as planned.   ------------------------------------------------  Jodelle Gross, MD, PhD  This document serves as a record of services personally performed by Kyung Rudd, MD. It was created on his behalf by Darcus Austin, a trained medical scribe. The creation of this record is based on the scribe's personal observations and the provider's statements to them. This document has been checked and approved by the attending provider.

## 2016-02-17 ENCOUNTER — Ambulatory Visit
Admission: RE | Admit: 2016-02-17 | Discharge: 2016-02-17 | Disposition: A | Discharge status: Home / Self Care | Payer: 59 | Source: Ambulatory Visit | Attending: Radiation Oncology | Admitting: Radiation Oncology

## 2016-02-17 DIAGNOSIS — C50912 Malignant neoplasm of unspecified site of left female breast: Secondary | ICD-10-CM | POA: Diagnosis not present

## 2016-02-18 ENCOUNTER — Ambulatory Visit
Admission: RE | Admit: 2016-02-18 | Discharge: 2016-02-18 | Disposition: A | Discharge status: Home / Self Care | Payer: 59 | Source: Ambulatory Visit | Attending: Radiation Oncology | Admitting: Radiation Oncology

## 2016-02-18 DIAGNOSIS — C50912 Malignant neoplasm of unspecified site of left female breast: Secondary | ICD-10-CM | POA: Diagnosis not present

## 2016-02-19 ENCOUNTER — Ambulatory Visit
Admission: RE | Admit: 2016-02-19 | Discharge: 2016-02-19 | Disposition: A | Discharge status: Home / Self Care | Payer: 59 | Source: Ambulatory Visit | Attending: Radiation Oncology | Admitting: Radiation Oncology

## 2016-02-19 DIAGNOSIS — C50912 Malignant neoplasm of unspecified site of left female breast: Secondary | ICD-10-CM | POA: Diagnosis not present

## 2016-02-20 ENCOUNTER — Encounter: Payer: Self-pay | Admitting: Radiation Oncology

## 2016-02-20 ENCOUNTER — Ambulatory Visit
Admission: RE | Admit: 2016-02-20 | Discharge: 2016-02-20 | Disposition: A | Discharge status: Home / Self Care | Payer: 59 | Source: Ambulatory Visit | Attending: Radiation Oncology | Admitting: Radiation Oncology

## 2016-02-20 ENCOUNTER — Telehealth: Payer: Self-pay | Admitting: *Deleted

## 2016-02-20 VITALS — BP 136/83 | HR 72 | Temp 98.4°F

## 2016-02-20 DIAGNOSIS — Z170 Estrogen receptor positive status [ER+]: Principal | ICD-10-CM

## 2016-02-20 DIAGNOSIS — C50212 Malignant neoplasm of upper-inner quadrant of left female breast: Secondary | ICD-10-CM

## 2016-02-20 DIAGNOSIS — C50912 Malignant neoplasm of unspecified site of left female breast: Secondary | ICD-10-CM | POA: Diagnosis not present

## 2016-02-20 MED ORDER — SILVER SULFADIAZINE 1 % EX CREA
TOPICAL_CREAM | Freq: Every day | CUTANEOUS | Status: DC
  Administered 2016-02-20: 11:00:00 via TOPICAL

## 2016-02-20 NOTE — Progress Notes (Addendum)
Weekly rad txs left breast 33/33 completed, bright erythema,under inframmary fold,some peeling, will use nesoporin to put there,  radaiplex bid, 1 month f/u appt given  04/07/16  With Shona Simpson, PA, appetite  Good, energy level is better 10:41 AM BP 136/83 (BP Location: Left Arm, Patient Position: Sitting, Cuff Size: Normal)   Pulse 72   Temp 98.4 F (36.9 C) (Oral)   Wt Readings from Last 3 Encounters:  02/05/16 208 lb (94.3 kg)  02/05/16 208 lb 9.6 oz (94.6 kg)  01/30/16 207 lb 9.6 oz (94.2 kg)

## 2016-02-20 NOTE — Progress Notes (Signed)
Silvadene given per MD , pt allergiv to neomycin triple antibiotic, to applt to affected left breast moist desquamation area daily 11:02 AM

## 2016-02-20 NOTE — Progress Notes (Signed)
  Radiation Oncology         [phone]) [phone] ________________________________  Name: Christina Rivera MRN: [id number]  Date: 02/20/2016  DOB: June 29, 1955     Weekly Radiation Therapy Management    ICD-9-CM ICD-10-CM   1. Malignant neoplasm of upper-inner quadrant of left breast in female, estrogen receptor positive (HCC) 174.2 C50.212 silver sulfADIAZINE (SILVADENE) 1 % cream   V86.0 Z17.0      Current Dose: 60.4 Gy     Planned Dose:  60.4 Gy  Narrative . . . . . . . . The patient presents for routine under treatment assessment.                                 She returns for weekly radiation treatment to the left breast 33/33 completed. Per nursing, there is bright erythema under the inframammary fold with some peeling.  She is using radiaplex twice daily. Appetite is good. Energy level is better.                                  Set-up films were reviewed.                                 The chart was checked. Physical Findings. . .  oral temperature is 98.4 F (36.9 C). Her blood pressure is 136/83 and her pulse is 72. . Weight essentially stable. Lungs are clear to auscultation bilaterally. Heart has regular rate and rhythm. No palpable cervical, supraclavicular, or axillary adenopathy. Abdomen soft, non-tender, normal bowel sounds.   Breast exam performed by Shona Simpson, PAC: The left breast is assessed and hyperpigmentation of the entire breast is noted with minimal moist desquamation along the left inframammary fold. No other areas of desquamation are noted. The nipple is intact, and no chest wall or breast edema is present.   Impression . . . . . . . The patient is tolerating radiation. Plan . . . . . . . . . . . . She has completed radiation treatment today. She was given Silvadene cream for the area of moist desquamation (allergy to neomycin).  She will return for follow up in 1 month with Shona Simpson, PA-C.   ________________________________   Blair Promise, PhD,  MD  This document serves as a record of services personally performed by Gery Pray, MD and Shona Simpson, PA-C. It was created on his behalf by Arlyce Harman, a trained medical scribe. The creation of this record is based on the scribe's personal observations and the provider's statements to them. This document has been checked and approved by the attending provider.

## 2016-02-20 NOTE — Telephone Encounter (Signed)
  Oncology Nurse Navigator Documentation  Navigator Location: [facility] (02/20/16 1200)   )Navigator Encounter Type: Telephone (02/20/16 1200) Telephone: Outgoing Call (02/20/16 1200)                   Patient Visit Type: RadOnc (02/20/16 1200) Treatment Phase: Final Radiation Tx (02/20/16 1200)                            Time Spent with Patient: 15 (02/20/16 1200)    

## 2016-02-26 NOTE — Progress Notes (Incomplete)
°  Radiation Oncology         [phone]) [phone] ________________________________  Name: Christina Rivera MRN: [id number]  Date: 02/20/2016  DOB: [birth year]  End of Treatment Note  Diagnosis:   Stage IA, T1a, N0, M0, invasive ductal carcinoma, ER/PR postive of the left breast.       ICD-9-CM ICD-10-CM   1. Breast cancer of upper-inner quadrant of left female breast ([place]) 174.2 C50.212     Indication for treatment:  Curative       Radiation treatment dates:   01/07/2016 to 02/20/2016  Site/dose:    1. The Left breast was treated to 50.4 Gy in 28 fractions at 1.8 Gy per fraction. 2. The Left breast was boosted to 10 Gy in 5 fractions at 2 Gy per fraction.   Beams/energy:    1. 3D // 10X, 6X 2. Isodose plan // 12 MeV  Narrative: The patient tolerated radiation treatment relatively well.   Her energy level had begun to improve towards the end of treatment. She developed hyperpigmentation of the entire left breast with minimal moist desquamation along the left inframammary fold. There were no other areas of desquamation noted. The nipple was intact, and no chest wall or breast edema was present.   Plan: The patient has completed radiation treatment. The patient will return to radiation oncology clinic for routine followup in one month. She was given Silvadene cream for the area of moist desquamation (allergy to neomycin). I advised them to call or return sooner if they have any questions or concerns related to their recovery or treatment.  ------------------------------------------------  Jodelle Gross, MD, PhD  This document serves as a record of services personally performed by Kyung Rudd, MD. It was created on his behalf by Arlyce Harman, a trained medical scribe. The creation of this record is based on the scribe's personal observations and the provider's statements to them. This document has been checked and approved by the attending provider.

## 2016-03-01 NOTE — Progress Notes (Signed)
  Radiation Oncology         [phone]) [phone] ________________________________  Name: Christina Rivera MRN: [id number]  Date: 01/29/2016  DOB: [birth year]  Complex simulation note  The patient has undergone complex simulation for her upcoming boost treatment for her diagnosis of left sided breast cancer. The patient has initially been planned to receive 50.4 Gy. The patient will now receive a 10 Gy boost to the seroma cavity which has been contoured. This will be accomplished using an en face electron field. Based on the depth of the target area, 12 MeV electrons will be used. The patient's final total dose therefore will be 60.4 Gy. A complex isodose plan from the electron [facility] Carlo calculation is requested for the boost treatment.   _______________________________  Jodelle Gross, MD, PhD

## 2016-03-08 NOTE — Assessment & Plan Note (Signed)
Left lumpectomy 12/04/2015: IDC with DCIS, 0.8 cm, margins negative, 0/5 lymph nodes negative, ER 90%, PR 90%, HER-2 negative, Ki-67 3%, T1 cN0 stage IA Oncotype Dx 10 (7% ROR) Adj XRT 01/07/16 to 02/20/16  Treatment Plan: Adjuvant antiestrogen therapy with anastrozole 1 mg daily 5 years Anastrozole Toxicities: We discussed the risks and benefits of anti-estrogen therapy with aromatase inhibitors. These include but not limited to insomnia, hot flashes, mood changes, vaginal dryness, bone density loss, and weight gain. We strongly believe that the benefits far outweigh the risks. Patient understands these risks and consented to starting treatment. Planned treatment duration is 5 years.  RTC in 3 months for follow up

## 2016-03-10 ENCOUNTER — Encounter: Payer: Self-pay | Admitting: Hematology and Oncology

## 2016-03-10 ENCOUNTER — Ambulatory Visit (HOSPITAL_BASED_OUTPATIENT_CLINIC_OR_DEPARTMENT_OTHER): Payer: 59 | Admitting: Hematology and Oncology

## 2016-03-10 DIAGNOSIS — Z170 Estrogen receptor positive status [ER+]: Secondary | ICD-10-CM

## 2016-03-10 DIAGNOSIS — C50212 Malignant neoplasm of upper-inner quadrant of left female breast: Secondary | ICD-10-CM | POA: Diagnosis not present

## 2016-03-10 MED ORDER — ANASTROZOLE 1 MG PO TABS: 1.0000 mg | ORAL_TABLET | Freq: Every day | ORAL | 3 refills | Status: DC

## 2016-03-10 NOTE — Progress Notes (Signed)
Patient Care Team: Deland Pretty, MD as PCP - General (Internal Medicine) Stark Klein, MD as Consulting Physician (General Surgery) Nicholas Lose, MD as Consulting Physician (Hematology and Oncology) Nicholas Lose, MD as Consulting Physician (Hematology and Oncology)  DIAGNOSIS:  Encounter Diagnosis  Name Primary?  . Malignant neoplasm of upper-inner quadrant of left breast in female, estrogen receptor positive ([place])     SUMMARY OF ONCOLOGIC HISTORY:   Breast cancer of upper-inner quadrant of left female breast ([place])   11/13/2015 Initial Diagnosis    Left breast biopsy: Invasive ductal carcinoma with ADH, grade 1, ER 90%, PR 90%, Ki-67 3%, HER-2 negative ratio 1.35 copy #2; left breast spiculated mass 8 mm at 9:00 position, axilla negative, T1 BN 0 stage IA clinical stage      12/04/2015 Surgery    Left lumpectomy: IDC with DCIS, 0.8 cm, margins negative, 0/5 lymph nodes negative, ER 90%, PR 90%, HER-2 negative, Ki-67 3%, T1 cN0 stage IA, Oncotype DX 10 (7%)      01/07/2016 - 02/20/2016 Radiation Therapy    Adjuvant XRT      03/10/2016 -  Anti-estrogen oral therapy    Anastrozole 1 mg by mouth daily       CHIEF COMPLIANT: Follow-up after adjuvant radiation  INTERVAL HISTORY: Christina Rivera is a 60 year old with above-mentioned history of left breast cancer treated with lumpectomy and radiation and is here to discuss starting anastrozole therapy. She had done very well from radiation standpoint and is healing quite well. She is accompanied by her husband today.  REVIEW OF SYSTEMS:   Constitutional: Denies fevers, chills or abnormal weight loss Eyes: Denies blurriness of vision Ears, nose, mouth, throat, and face: Denies mucositis or sore throat Respiratory: Denies cough, dyspnea or wheezes Cardiovascular: Denies palpitation, chest discomfort Gastrointestinal:  Denies nausea, heartburn or change in bowel habits Skin: Denies abnormal skin rashes Lymphatics: Denies new  lymphadenopathy or easy bruising Neurological:Denies numbness, tingling or new weaknesses Behavioral/Psych: Mood is stable, no new changes  Extremities: No lower extremity edema Breast: Radiation dermatitis is improving All other systems were reviewed with the patient and are negative.  I have reviewed the past medical history, past surgical history, social history and family history with the patient and they are unchanged from previous note.  ALLERGIES:  is allergic to tramadol; doxycycline; neomycin; and tape.  MEDICATIONS:  Current Outpatient Prescriptions  Medication Sig Dispense Refill  . anastrozole (ARIMIDEX) 1 MG tablet Take 1 tablet (1 mg total) by mouth daily. 90 tablet 3  . fexofenadine (ALLEGRA) 180 MG tablet Take 180 mg by mouth daily.    Marland Kitchen FINACEA 15 % cream Apply 1 application topically 2 (two) times daily.     . Triamcinolone Acetonide (NASACORT ALLERGY 24HR NA) Place into the nose daily.     No current facility-administered medications for this visit.     PHYSICAL EXAMINATION: ECOG PERFORMANCE STATUS: 1 - Symptomatic but completely ambulatory  Vitals:   03/10/16 1105  BP: (!) 144/71  Pulse: 70  Resp: 18  Temp: 97.6 F (36.4 C)   Filed Weights   03/10/16 1105  Weight: 212 lb 14.4 oz (96.6 kg)    GENERAL:alert, no distress and comfortable SKIN: skin color, texture, turgor are normal, no rashes or significant lesions EYES: normal, Conjunctiva are pink and non-injected, sclera clear OROPHARYNX:no exudate, no erythema and lips, buccal mucosa, and tongue normal  NECK: supple, thyroid normal size, non-tender, without nodularity LYMPH:  no palpable lymphadenopathy in the cervical, axillary or  inguinal LUNGS: clear to auscultation and percussion with normal breathing effort HEART: regular rate & rhythm and no murmurs and no lower extremity edema ABDOMEN:abdomen soft, non-tender and normal bowel sounds MUSCULOSKELETAL:no cyanosis of digits and no clubbing    NEURO: alert & oriented x 3 with fluent speech, no focal motor/sensory deficits EXTREMITIES: No lower extremity edema  LABORATORY DATA:  I have reviewed the data as listed   Chemistry      Component Value Date/Time   NA 141 11/20/2015 1225   K 3.8 11/20/2015 1225   CL 102 09/29/2006 1047   CO2 25 11/20/2015 1225   BUN 17.3 11/20/2015 1225   CREATININE 0.8 11/20/2015 1225      Component Value Date/Time   CALCIUM 9.5 11/20/2015 1225   ALKPHOS 55 11/20/2015 1225   AST 18 11/20/2015 1225   ALT 24 11/20/2015 1225   BILITOT 0.37 11/20/2015 1225       Lab Results  Component Value Date   WBC 9.1 11/20/2015   HGB 14.2 11/20/2015   HCT 43.5 11/20/2015   MCV 88.0 11/20/2015   PLT 291 11/20/2015   NEUTROABS 5.8 11/20/2015     ASSESSMENT & PLAN:  Breast cancer of upper-inner quadrant of left female breast ([place]) Left lumpectomy 12/04/2015: IDC with DCIS, 0.8 cm, margins negative, 0/5 lymph nodes negative, ER 90%, PR 90%, HER-2 negative, Ki-67 3%, T1 cN0 stage IA Oncotype Dx 10 (7% ROR) Adj XRT 01/07/16 to 02/20/16  Treatment Plan: Adjuvant antiestrogen therapy with anastrozole 1 mg daily 5 years Anastrozole Toxicities: We discussed the risks and benefits of anti-estrogen therapy with aromatase inhibitors. These include but not limited to insomnia, hot flashes, mood changes, vaginal dryness, bone density loss, and weight gain. We strongly believe that the benefits far outweigh the risks. Patient understands these risks and consented to starting treatment. Planned treatment duration is 5 years.  [place] in 3 months for follow up   No orders of the defined types were placed in this encounter.  The patient has a good understanding of the overall plan. she agrees with it. she will call with any problems that may develop before the next visit here.   Rulon Eisenmenger, MD 03/10/16

## 2016-03-31 NOTE — Progress Notes (Signed)
Mrs. Christina Rivera 60 year old female is here for a one month follow up appointment for left breast cancer.  Skin status:Left to left breast with mild tanning. What lotion are you using? Not using any lotion with vitamin E. Have you seen med onc? If not, when is appointment:03-10-16 Nicholas Lose If they are ER+, have they started Al or Tamoxifen? If not, why? 03-17-16 Anastrozole Discuss survivorship appointment. Not scheduled yet with Mike Craze, N.P. Have you had a mammogram scheduled? Not scheduled yet Offer referral to [facility]. Will receive at the survivorship appointment. Appetite: Good Pain:No Fatigue:Having fatigue at times just adjust her sleeping pattern. Arm mobility: Not having any discomfort raising her left arm. Wt Readings from Last 3 Encounters:  04/07/16 208 lb (94.3 kg)  03/10/16 212 lb 14.4 oz (96.6 kg)  02/05/16 208 lb (94.3 kg)  BP (!) 146/69   Pulse 74   Temp 99.3 F (37.4 C) (Oral)   Resp 18   Ht 5\' 10"  (1.778 m)   Wt 208 lb (94.3 kg)   BMI 29.84 kg/m

## 2016-04-07 ENCOUNTER — Ambulatory Visit
Admission: RE | Admit: 2016-04-07 | Discharge: 2016-04-07 | Disposition: A | Discharge status: Home / Self Care | Payer: 59 | Source: Ambulatory Visit | Attending: Radiation Oncology | Admitting: Radiation Oncology

## 2016-04-07 ENCOUNTER — Encounter: Payer: Self-pay | Admitting: Radiation Oncology

## 2016-04-07 VITALS — BP 146/69 | HR 74 | Temp 99.3°F | Resp 18 | Ht 70.0 in | Wt 208.0 lb

## 2016-04-07 DIAGNOSIS — Z170 Estrogen receptor positive status [ER+]: Secondary | ICD-10-CM | POA: Diagnosis not present

## 2016-04-07 DIAGNOSIS — C50912 Malignant neoplasm of unspecified site of left female breast: Secondary | ICD-10-CM | POA: Insufficient documentation

## 2016-04-07 DIAGNOSIS — C50212 Malignant neoplasm of upper-inner quadrant of left female breast: Secondary | ICD-10-CM

## 2016-04-07 NOTE — Addendum Note (Signed)
Encounter addended by: Malena Edman, RN on: 04/07/2016  4:08 PM<BR>    Actions taken: Charge Capture section accepted

## 2016-04-07 NOTE — Progress Notes (Signed)
  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  Interval Since Last Radiation:  7 weeks   01/07/2016 to 02/20/2016: 1. The Left breast was treated to 50.4 Gy in 28 fractions at 1.8 Gy per fraction. 2. The Left breast was boosted to 10 Gy in 5 fractions at 2 Gy per fraction.   Narrative:  The patient returns today for routine follow-up. During treatment she did very well with radiotherapy and did not have significant desquamation.                             On review of systems, the patient states she is doing great. She has noticed some muscle cramps and joint aches since starting her anastrazole. She denies concerns with her skin at this time. No other complaints are verbalized.  ALLERGIES:  is allergic to tramadol; doxycycline; neomycin; and tape.  Meds: Current Outpatient Prescriptions  Medication Sig Dispense Refill  . anastrozole (ARIMIDEX) 1 MG tablet Take 1 tablet (1 mg total) by mouth daily. 90 tablet 3  . fexofenadine (ALLEGRA) 180 MG tablet Take 180 mg by mouth daily.    Marland Kitchen FINACEA 15 % cream Apply 1 application topically 2 (two) times daily.     . Triamcinolone Acetonide (NASACORT ALLERGY 24HR NA) Place into the nose daily.     No current facility-administered medications for this encounter.     Physical Findings:  height is 5\' 10"  (1.778 m) and weight is 208 lb (94.3 kg). Her oral temperature is 99.3 F (37.4 C). Her blood pressure is 146/69 (abnormal) and her pulse is 74. Her respiration is 18.  In general this is a well appearing caucasian female in no acute distress. She's alert and oriented x4 and appropriate throughout the examination. Cardiopulmonary assessment is negative for acute distress and she  exhibits normal effort. The left breast was examined and reveals no evidence of desquamation. Mild hyperpigmentation is noted in the inframammary fold.   Lab Findings: Lab Results  Component Value Date   WBC 9.1 11/20/2015   HGB 14.2 11/20/2015   HCT 43.5 11/20/2015   MCV 88.0 11/20/2015   PLT 291 11/20/2015     Radiographic Findings: No results found.  Impression/Plan: 1. 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 The patient has been doing well since completion of radiotherapy. We discussed that we would be happy to continue to follow her as needed, but she will also continue to follow up with Dr. Lindi Adie in medical oncology for her anastrozole and follow up. She was counseled on skin care and encouraged to consider sunscreen over the upper chest wall to avoid sunburn.       Carola Rhine, PAC

## 2016-04-21 DIAGNOSIS — M5136 Other intervertebral disc degeneration, lumbar region: Secondary | ICD-10-CM | POA: Diagnosis not present

## 2016-04-21 DIAGNOSIS — M5432 Sciatica, left side: Secondary | ICD-10-CM | POA: Diagnosis not present

## 2016-04-22 DIAGNOSIS — M4722 Other spondylosis with radiculopathy, cervical region: Secondary | ICD-10-CM | POA: Diagnosis not present

## 2016-04-22 DIAGNOSIS — M9901 Segmental and somatic dysfunction of cervical region: Secondary | ICD-10-CM | POA: Diagnosis not present

## 2016-04-22 DIAGNOSIS — M9903 Segmental and somatic dysfunction of lumbar region: Secondary | ICD-10-CM | POA: Diagnosis not present

## 2016-04-23 DIAGNOSIS — M4722 Other spondylosis with radiculopathy, cervical region: Secondary | ICD-10-CM | POA: Diagnosis not present

## 2016-04-23 DIAGNOSIS — M9903 Segmental and somatic dysfunction of lumbar region: Secondary | ICD-10-CM | POA: Diagnosis not present

## 2016-04-23 DIAGNOSIS — M9901 Segmental and somatic dysfunction of cervical region: Secondary | ICD-10-CM | POA: Diagnosis not present

## 2016-04-27 DIAGNOSIS — M9901 Segmental and somatic dysfunction of cervical region: Secondary | ICD-10-CM | POA: Diagnosis not present

## 2016-04-27 DIAGNOSIS — Z0000 Encounter for general adult medical examination without abnormal findings: Secondary | ICD-10-CM | POA: Diagnosis not present

## 2016-04-27 DIAGNOSIS — M9903 Segmental and somatic dysfunction of lumbar region: Secondary | ICD-10-CM | POA: Diagnosis not present

## 2016-04-27 DIAGNOSIS — M4722 Other spondylosis with radiculopathy, cervical region: Secondary | ICD-10-CM | POA: Diagnosis not present

## 2016-04-28 DIAGNOSIS — M4722 Other spondylosis with radiculopathy, cervical region: Secondary | ICD-10-CM | POA: Diagnosis not present

## 2016-04-28 DIAGNOSIS — M9903 Segmental and somatic dysfunction of lumbar region: Secondary | ICD-10-CM | POA: Diagnosis not present

## 2016-04-28 DIAGNOSIS — M9901 Segmental and somatic dysfunction of cervical region: Secondary | ICD-10-CM | POA: Diagnosis not present

## 2016-04-29 DIAGNOSIS — M4722 Other spondylosis with radiculopathy, cervical region: Secondary | ICD-10-CM | POA: Diagnosis not present

## 2016-04-29 DIAGNOSIS — M9903 Segmental and somatic dysfunction of lumbar region: Secondary | ICD-10-CM | POA: Diagnosis not present

## 2016-04-29 DIAGNOSIS — M9901 Segmental and somatic dysfunction of cervical region: Secondary | ICD-10-CM | POA: Diagnosis not present

## 2016-04-30 DIAGNOSIS — M9903 Segmental and somatic dysfunction of lumbar region: Secondary | ICD-10-CM | POA: Diagnosis not present

## 2016-04-30 DIAGNOSIS — Z780 Asymptomatic menopausal state: Secondary | ICD-10-CM | POA: Diagnosis not present

## 2016-04-30 DIAGNOSIS — M4722 Other spondylosis with radiculopathy, cervical region: Secondary | ICD-10-CM | POA: Diagnosis not present

## 2016-04-30 DIAGNOSIS — Z0000 Encounter for general adult medical examination without abnormal findings: Secondary | ICD-10-CM | POA: Diagnosis not present

## 2016-04-30 DIAGNOSIS — M9901 Segmental and somatic dysfunction of cervical region: Secondary | ICD-10-CM | POA: Diagnosis not present

## 2016-05-04 DIAGNOSIS — M4722 Other spondylosis with radiculopathy, cervical region: Secondary | ICD-10-CM | POA: Diagnosis not present

## 2016-05-04 DIAGNOSIS — M9901 Segmental and somatic dysfunction of cervical region: Secondary | ICD-10-CM | POA: Diagnosis not present

## 2016-05-04 DIAGNOSIS — M9903 Segmental and somatic dysfunction of lumbar region: Secondary | ICD-10-CM | POA: Diagnosis not present

## 2016-05-05 DIAGNOSIS — M9901 Segmental and somatic dysfunction of cervical region: Secondary | ICD-10-CM | POA: Diagnosis not present

## 2016-05-05 DIAGNOSIS — M9903 Segmental and somatic dysfunction of lumbar region: Secondary | ICD-10-CM | POA: Diagnosis not present

## 2016-05-05 DIAGNOSIS — M4722 Other spondylosis with radiculopathy, cervical region: Secondary | ICD-10-CM | POA: Diagnosis not present

## 2016-05-14 NOTE — Progress Notes (Signed)
Received fax of density report. F/u appt with dr. Lindi Adie 06/10/16

## 2016-05-18 DIAGNOSIS — M9901 Segmental and somatic dysfunction of cervical region: Secondary | ICD-10-CM | POA: Diagnosis not present

## 2016-05-18 DIAGNOSIS — M4722 Other spondylosis with radiculopathy, cervical region: Secondary | ICD-10-CM | POA: Diagnosis not present

## 2016-05-18 DIAGNOSIS — M9903 Segmental and somatic dysfunction of lumbar region: Secondary | ICD-10-CM | POA: Diagnosis not present

## 2016-05-20 DIAGNOSIS — M4722 Other spondylosis with radiculopathy, cervical region: Secondary | ICD-10-CM | POA: Diagnosis not present

## 2016-05-20 DIAGNOSIS — M9903 Segmental and somatic dysfunction of lumbar region: Secondary | ICD-10-CM | POA: Diagnosis not present

## 2016-05-20 DIAGNOSIS — M9901 Segmental and somatic dysfunction of cervical region: Secondary | ICD-10-CM | POA: Diagnosis not present

## 2016-05-28 DIAGNOSIS — M4722 Other spondylosis with radiculopathy, cervical region: Secondary | ICD-10-CM | POA: Diagnosis not present

## 2016-05-28 DIAGNOSIS — M9901 Segmental and somatic dysfunction of cervical region: Secondary | ICD-10-CM | POA: Diagnosis not present

## 2016-05-28 DIAGNOSIS — M9903 Segmental and somatic dysfunction of lumbar region: Secondary | ICD-10-CM | POA: Diagnosis not present

## 2016-06-09 NOTE — Assessment & Plan Note (Signed)
Left lumpectomy 12/04/2015: IDC with DCIS, 0.8 cm, margins negative, 0/5 lymph nodes negative, ER 90%, PR 90%, HER-2 negative, Ki-67 3%, T1 cN0 stage IA Oncotype Dx 10 (7% ROR) Adj XRT 01/07/16 to 02/20/16  Treatment Plan: Adjuvant antiestrogen therapy with anastrozole 1 mg daily 5 years started 03/10/2016 Anastrozole Toxicities:   Patient [place]. RTC in 6 months for follow up 

## 2016-06-10 ENCOUNTER — Encounter: Payer: Self-pay | Admitting: Hematology and Oncology

## 2016-06-10 ENCOUNTER — Ambulatory Visit (HOSPITAL_BASED_OUTPATIENT_CLINIC_OR_DEPARTMENT_OTHER): Payer: 59 | Admitting: Hematology and Oncology

## 2016-06-10 DIAGNOSIS — C50212 Malignant neoplasm of upper-inner quadrant of left female breast: Secondary | ICD-10-CM

## 2016-06-10 DIAGNOSIS — Z170 Estrogen receptor positive status [ER+]: Secondary | ICD-10-CM | POA: Diagnosis not present

## 2016-06-10 NOTE — Progress Notes (Signed)
Patient Care Team: Deland Pretty, MD as PCP - General (Internal Medicine) Stark Klein, MD as Consulting Physician (General Surgery) Nicholas Lose, MD as Consulting Physician (Hematology and Oncology) Nicholas Lose, MD as Consulting Physician (Hematology and Oncology)  DIAGNOSIS:  Encounter Diagnosis  Name Primary?  . Malignant neoplasm of upper-inner quadrant of left breast in female, estrogen receptor positive ([place])     SUMMARY OF ONCOLOGIC HISTORY:   Breast cancer of upper-inner quadrant of left female breast ([place])   11/13/2015 Initial Diagnosis    Left breast biopsy: Invasive ductal carcinoma with ADH, grade 1, ER 90%, PR 90%, Ki-67 3%, HER-2 negative ratio 1.35 copy #2; left breast spiculated mass 8 mm at 9:00 position, axilla negative, T1 BN 0 stage IA clinical stage      12/04/2015 Surgery    Left lumpectomy: IDC with DCIS, 0.8 cm, margins negative, 0/5 lymph nodes negative, ER 90%, PR 90%, HER-2 negative, Ki-67 3%, T1 cN0 stage IA, Oncotype DX 10 (7%)      01/07/2016 - 02/20/2016 Radiation Therapy    Adjuvant XRT      03/10/2016 -  Anti-estrogen oral therapy    Anastrozole 1 mg by mouth daily       CHIEF COMPLIANT: Follow-up on anastrozole therapy  INTERVAL HISTORY: Christina Rivera is a 61 year old with above-mentioned history left breast cancer treated with lumpectomy and radiation is currently on anastrozole. She is tolerating it fairly well. She does have occasional muscle aches pains. Denies any hot flashes. Denies any lumps or nodules in the breasts.  REVIEW OF SYSTEMS:   Constitutional: Denies fevers, chills or abnormal weight loss Eyes: Denies blurriness of vision Ears, nose, mouth, throat, and face: Denies mucositis or sore throat Respiratory: Denies cough, dyspnea or wheezes Cardiovascular: Denies palpitation, chest discomfort Gastrointestinal:  Denies nausea, heartburn or change in bowel habits Skin: Denies abnormal skin rashes Lymphatics: Denies new  lymphadenopathy or easy bruising Neurological:Denies numbness, tingling or new weaknesses Behavioral/Psych: Mood is stable, no new changes  Extremities: No lower extremity edema Breast:  denies any pain or lumps or nodules in either breasts All other systems were reviewed with the patient and are negative.  I have reviewed the past medical history, past surgical history, social history and family history with the patient and they are unchanged from previous note.  ALLERGIES:  is allergic to tramadol; doxycycline; neomycin; and tape.  MEDICATIONS:  Current Outpatient Prescriptions  Medication Sig Dispense Refill  . anastrozole (ARIMIDEX) 1 MG tablet Take 1 tablet (1 mg total) by mouth daily. 90 tablet 3  . fexofenadine (ALLEGRA) 180 MG tablet Take 180 mg by mouth daily.    Marland Kitchen FINACEA 15 % cream Apply 1 application topically 2 (two) times daily.     . Triamcinolone Acetonide (NASACORT ALLERGY 24HR NA) Place into the nose daily.     No current facility-administered medications for this visit.     PHYSICAL EXAMINATION: ECOG PERFORMANCE STATUS: 1 - Symptomatic but completely ambulatory  Vitals:   06/10/16 1022  BP: (!) 147/62  Pulse: 73  Resp: 19  Temp: 99.7 F (37.6 C)   Filed Weights   06/10/16 1022  Weight: 213 lb 4.8 oz (96.8 kg)    GENERAL:alert, no distress and comfortable SKIN: skin color, texture, turgor are normal, no rashes or significant lesions EYES: normal, Conjunctiva are pink and non-injected, sclera clear OROPHARYNX:no exudate, no erythema and lips, buccal mucosa, and tongue normal  NECK: supple, thyroid normal size, non-tender, without nodularity LYMPH:  no  palpable lymphadenopathy in the cervical, axillary or inguinal LUNGS: clear to auscultation and percussion with normal breathing effort HEART: regular rate & rhythm and no murmurs and no lower extremity edema ABDOMEN:abdomen soft, non-tender and normal bowel sounds MUSCULOSKELETAL:no cyanosis of digits and  no clubbing  NEURO: alert & oriented x 3 with fluent speech, no focal motor/sensory deficits EXTREMITIES: No lower extremity edema BREAST: No palpable masses or nodules in either right or left breasts. No palpable axillary supraclavicular or infraclavicular adenopathy no breast tenderness or nipple discharge. (exam performed in the presence of a chaperone)  LABORATORY DATA:  I have reviewed the data as listed   Chemistry      Component Value Date/Time   NA 141 11/20/2015 1225   K 3.8 11/20/2015 1225   CL 102 09/29/2006 1047   CO2 25 11/20/2015 1225   BUN 17.3 11/20/2015 1225   CREATININE 0.8 11/20/2015 1225      Component Value Date/Time   CALCIUM 9.5 11/20/2015 1225   ALKPHOS 55 11/20/2015 1225   AST 18 11/20/2015 1225   ALT 24 11/20/2015 1225   BILITOT 0.37 11/20/2015 1225       Lab Results  Component Value Date   WBC 9.1 11/20/2015   HGB 14.2 11/20/2015   HCT 43.5 11/20/2015   MCV 88.0 11/20/2015   PLT 291 11/20/2015   NEUTROABS 5.8 11/20/2015    ASSESSMENT & PLAN:  Breast cancer of upper-inner quadrant of left female breast ([place]) Left lumpectomy 12/04/2015: IDC with DCIS, 0.8 cm, margins negative, 0/5 lymph nodes negative, ER 90%, PR 90%, HER-2 negative, Ki-67 3%, T1 cN0 stage IA Oncotype Dx 10 (7% ROR) Adj XRT 01/07/16 to 02/20/16  Treatment Plan: Adjuvant antiestrogen therapy with anastrozole 1 mg daily 5 years started 03/10/2016 Anastrozole Toxicities:  1. Occasional muscle cramps Denies any hot flashes  Patient is planning to spend the next 3 months on a beach location. She has [place] a property on the beach and is planning to run elevated. RTC in 6 months for follow up   I spent 25 minutes talking to the patient of which more than half was spent in counseling and coordination of care.  No orders of the defined types were placed in this encounter.  The patient has a good understanding of the overall plan. she agrees with it. she will call with any  problems that may develop before the next visit here.   Rulon Eisenmenger, MD 06/10/16

## 2016-06-15 DIAGNOSIS — C50212 Malignant neoplasm of upper-inner quadrant of left female breast: Secondary | ICD-10-CM | POA: Diagnosis not present

## 2016-06-15 DIAGNOSIS — I890 Lymphedema, not elsewhere classified: Secondary | ICD-10-CM | POA: Diagnosis not present

## 2016-07-08 ENCOUNTER — Ambulatory Visit: Payer: 59 | Attending: General Surgery | Admitting: Physical Therapy

## 2016-07-08 DIAGNOSIS — L599 Disorder of the skin and subcutaneous tissue related to radiation, unspecified: Secondary | ICD-10-CM | POA: Diagnosis present

## 2016-07-08 DIAGNOSIS — Z483 Aftercare following surgery for neoplasm: Secondary | ICD-10-CM | POA: Insufficient documentation

## 2016-07-08 NOTE — Therapy (Signed)
[place], [place], [address] Phone: [phone]   Fax:  [phone]  Physical Therapy Evaluation  Patient Details  Name: Christina Rivera MRN: [id number] Date of Birth: Nov 07, 1955 Referring Provider: Dr. Stark Klein  Encounter Date: 07/08/2016      PT End of Session - 07/08/16 1350    Visit Number 1   Number of Visits 1   PT Start Time 1309   PT Stop Time 1349   PT Time Calculation (min) 40 min   Activity Tolerance Patient tolerated treatment well   Behavior During Therapy [facility] for tasks assessed/performed      Past Medical History:  Diagnosis Date  . Anxiety   . Arthritis   . Cancer ([place]) 11/2015   left breast  . Hot flashes   . Hx of degenerative disc disease   . Multiple thyroid nodules    being followed, no problems  . Vertigo     Past Surgical History:  Procedure Laterality Date  . bilateral hammertoe repair    . BREAST LUMPECTOMY WITH RADIOACTIVE SEED AND SENTINEL LYMPH NODE BIOPSY Left 12/04/2015   Procedure: BREAST LUMPECTOMY WITH RADIOACTIVE SEED AND SENTINEL LYMPH NODE BIOPSY;  Surgeon: Stark Klein, MD;  Location: [facility];  Service: General;  Laterality: Left;  BREAST LUMPECTOMY WITH RADIOACTIVE SEED AND SENTINEL LYMPH NODE BIOPSY  . LUMBAR MICRODISCECTOMY    . PLACEMENT OF BREAST IMPLANTS      There were no vitals filed for this visit.       Subjective Assessment - 07/08/16 1310    Subjective "I saw Dr. Barry Dienes a month ago maybe and at that time the left breast was larger than the right.  She said that underneath the breast she saw obvious pores and it was tender.  I was also having a tightness across the front of the breast with certain movements, but I'm not having that now." Pt. also had some cording identified by Dr. Barry Dienes.   Pertinent History Left breast cancer diagnosed 11/14/15; lumpectomy 12/03/15 with 5 nodes removed; radiation finished 02/20/16. On  anastrozole.  DDD entire spine and has pain at times; sees a chiropractor and that helps. Had microdiscectomy on L5-S1 and that.  Has had dry needling for left arm numbness and tingling.   Patient Stated Goals not exactly sure--to give me tips if I do notice the swelling again   Currently in Pain? No/denies            [facility] PT Assessment - 07/08/16 0001      Assessment   Medical Diagnosis left breast cancer   Referring Provider Dr. Stark Klein   Onset Date/Surgical Date 12/03/15   Hand Dominance Right   Prior Therapy none recently; has had dry needling     Precautions   Precautions Other (comment)   Precaution Comments cancer precautions     Restrictions   Weight Bearing Restrictions No     Balance Screen   Has the patient fallen in the past 6 months Yes   How many times? 1  tripped   Has the patient had a decrease in activity level because of a fear of falling?  No   Is the patient reluctant to leave their home because of a fear of falling?  No     Home Ecologist residence   Living Arrangements Spouse/significant other  and two pets   Type of [place]  Two level     Prior Function   Level of Independence Independent   Vocation Full time employment  but variable   Armed forces logistics/support/administrative officer  also helps two adult daughters and elderly mother   Leisure Tries to [employer], ride the bike, etc., but is not as regular as she would like     Cognition   Overall Cognitive Status Within Functional Limits for tasks assessed     Observation/Other Assessments   Observations Breasts show slight size differences, but left appears just slightly smaller than right; skin is just slightly darkened from radiation; tissue does not feel fibrotic nor indurated.     ROM / Strength   AROM / PROM / Strength AROM     AROM   Overall AROM Comments both shoulder AROM WFL throughout  has been doing breast  MDC stretches           LYMPHEDEMA/ONCOLOGY QUESTIONNAIRE - 07/08/16 1323      Type   Cancer Type left breast     Surgeries   Lumpectomy Date 12/03/15   Sentinel Lymph Node Biopsy Date 12/03/15   Number Lymph Nodes Removed 5     Treatment   Past Chemotherapy Treatment No   Past Radiation Treatment Yes   Date 02/20/16   Current Hormone Treatment Yes   Drug Name anastrozoloe     What other symptoms do you have   Other Symptoms breast swelling?     Lymphedema Assessments   Lymphedema Assessments Upper extremities     Right Upper Extremity Lymphedema   10 cm Proximal to Olecranon Process 33.7 cm   Olecranon Process 28.4 cm   10 cm Proximal to Ulnar Styloid Process 24.1 cm   Just Proximal to Ulnar Styloid Process 18 cm   Across Hand at [employer] 19.5 cm   At [place] of 2nd Digit 7.1 cm     Left Upper Extremity Lymphedema   10 cm Proximal to Olecranon Process 34.1 cm   Olecranon Process 28.4 cm   10 cm Proximal to Ulnar Styloid Process 23.9 cm   Just Proximal to Ulnar Styloid Process 18.6 cm   Across Hand at [employer] 19.2 cm   At [place] of 2nd Digit 7.1 cm   Other weight is not changed from before  surgery                        PT Education - 07/08/16 1350    Education provided Yes   Education Details she has been to ABC class, but reminded her about potential benefit of a prophylactic compression sleeve; also about using compression or sports bra   Person(s) Educated Patient   Methods Explanation   Comprehension Verbalized understanding                Long Term Clinic Goals - 07/08/16 1358      CC Long Term Goal  #1   Title Patient will be knowledgeable about signs of lymphedema and to seek reassessment should this flare up.   Status Achieved            Plan - 07/08/16 1351    Clinical Impression Statement Patient is s/p left breast lumpectomy and radiation, referred for breast swelling and cording that was noted on  her last visit about a month ago with Dr. Barry Dienes.  The patient reports that she feels like both issues have gotten better--the cording has resolved and  she doesn't feel like her breast is swollen right now.  She also had noted some left arm swelling at some time in the past, and this resolved spontaneously.  Today she shows good shoulder AROM.  Her arm circumference measurements show little change from pre-op measurements and little difference between right and left sides.  Her left breast does not appear to be swollen today.  Breast are both firm, but patient has implants.  The left does not feel particularly indurated today.  Pt. was given information about using compression for vigorous activity and to return to therapy if swelling flares, but she does not appear to have a need for treatment at this time.  Eval today is low complexity.   Rehab Potential Excellent   PT Frequency One time visit   PT Treatment/Interventions Patient/family education   PT Next Visit Plan None at this time.  Patient does not have an apparent need for therapy at this time. She has attended ABC class and got more information today about lymphedema risk reduction and availability of treatment should swelling flare up.   Recommended Other Services consider getting a compression sleeve for prophylaxis, to be worn for vigorous activity; wear compression bras for exercise and if she notices increased swelling   Consulted and Agree with Plan of Care Patient      Patient will benefit from skilled therapeutic intervention in order to improve the following deficits and impairments:  Decreased knowledge of use of DME, Decreased knowledge of precautions  Visit Diagnosis: Disorder of the skin and subcutaneous tissue related to radiation, unspecified - Plan: PT plan of care cert/re-cert  Aftercare following surgery for neoplasm - Plan: PT plan of care cert/re-cert     Problem List Patient Active Problem List   Diagnosis Date  Noted  . Breast cancer of upper-inner quadrant of left female breast ([place]) 11/15/2015    SALISBURY,DONNA 07/08/2016, 2:02 PM  [place], [place], [address] Phone: [phone]   Fax:  [phone]  Name: MARIEANN ZIPP MRN: [id number] Date of Birth: [birth year]  PHYSICAL THERAPY DISCHARGE SUMMARY  Visits from Start of Care: 1  Current functional level related to goals / functional outcomes:Goal met as noted above   Remaining deficits: None at this time.   Education / Equipment: Suggested she consider getting a compression sleeve for left arm for prophylaxis, and to wear sports bra as needed. Plan: Patient agrees to discharge.  Patient goals were met. Patient is being discharged due to meeting the stated rehab goals.  ?????    Serafina Royals, PT 07/08/16 2:03 PM

## 2016-07-23 DIAGNOSIS — Z1211 Encounter for screening for malignant neoplasm of colon: Secondary | ICD-10-CM | POA: Diagnosis not present

## 2016-07-23 DIAGNOSIS — Z1212 Encounter for screening for malignant neoplasm of rectum: Secondary | ICD-10-CM | POA: Diagnosis not present

## 2016-09-11 DIAGNOSIS — D2262 Melanocytic nevi of left upper limb, including shoulder: Secondary | ICD-10-CM | POA: Diagnosis not present

## 2016-09-11 DIAGNOSIS — D2261 Melanocytic nevi of right upper limb, including shoulder: Secondary | ICD-10-CM | POA: Diagnosis not present

## 2016-09-11 DIAGNOSIS — Z85828 Personal history of other malignant neoplasm of skin: Secondary | ICD-10-CM | POA: Diagnosis not present

## 2016-09-15 NOTE — Progress Notes (Signed)
CLINIC:  Survivorship   REASON FOR VISIT:  Routine follow-up post-treatment for a recent history of breast cancer.  BRIEF ONCOLOGIC HISTORY:    Breast cancer of upper-inner quadrant of left female breast ([place])   11/13/2015 Initial Diagnosis    Left breast biopsy: Invasive ductal carcinoma with ADH, grade 1, ER 90%, PR 90%, Ki-67 3%, HER-2 negative ratio 1.35 copy #2; left breast spiculated mass 8 mm at 9:00 position, axilla negative, T1 BN 0 stage IA clinical stage      12/04/2015 Surgery    Left lumpectomy [facility]): IDC with DCIS, 0.8 cm, margins negative, 0/5 lymph nodes negative, ER 90%, PR 90%, HER-2 negative, Ki-67 3%, T1 cN0 stage IA      12/04/2015 Oncotype testing    10/7%, low risk      01/07/2016 - 02/20/2016 Radiation Therapy    Adjuvant XRT ([facility]): 1. The Left breast was treated to 50.4 Gy in 28 fractions at 1.8 Gy per fraction. 2. The Left breast was boosted to 10 Gy in 5 fractions at 2 Gy per fraction.       03/10/2016 -  Anti-estrogen oral therapy    Anastrozole 1 mg by mouth daily       INTERVAL HISTORY:  Ms. Picone presents to the [facility] today for our initial meeting to review her survivorship care plan detailing her treatment course for breast cancer, as well as monitoring long-term side effects of that treatment, education regarding health maintenance, screening, and overall wellness and health promotion.     Overall, Ms. Dalpe reports feeling quite well.  She does have a left sided anterior pain underneath her breast that hurts only at night when lying down on her left or right side.  This has been going on for 4-6 weeks.  She has also been having some arthritic like pain in her right hand for a week that is getting better.   She is taking Anastrozole daily and is tolerating it well.  No other concerns noted.     REVIEW OF SYSTEMS:  Review of Systems  Constitutional: Negative for appetite change, chills, diaphoresis, fatigue, fever  and unexpected weight change.  HENT:   Negative for hearing loss and lump/mass.   Eyes: Negative for eye problems and icterus.  Respiratory: Negative for chest tightness, cough and shortness of breath.   Cardiovascular: Negative for chest pain and leg swelling.  Gastrointestinal: Negative for abdominal distention and abdominal pain.  Endocrine: Negative for hot flashes.  Musculoskeletal: Negative for arthralgias and gait problem.  Skin: Negative for itching and rash.  Neurological: Negative for dizziness, extremity weakness and gait problem.  Hematological: Negative for adenopathy. Does not bruise/bleed easily.  Psychiatric/Behavioral: Negative for depression. The patient is not nervous/anxious.   Breast: Denies any new nodularity, masses, tenderness, nipple changes, or nipple discharge.       ONCOLOGY TREATMENT TEAM:  1. Surgeon:  Dr. Barry Dienes at [facility] 2. Medical Oncologist: Dr. Lindi Adie  3. Radiation Oncologist: Dr. Sondra Come    PAST MEDICAL/SURGICAL HISTORY:  Past Medical History:  Diagnosis Date  . Anxiety   . Arthritis   . Cancer ([place]) 11/2015   left breast  . Hot flashes   . Hx of degenerative disc disease   . Multiple thyroid nodules    being followed, no problems  . Vertigo    Past Surgical History:  Procedure Laterality Date  . bilateral hammertoe repair    . BREAST LUMPECTOMY WITH RADIOACTIVE SEED AND SENTINEL LYMPH NODE  BIOPSY Left 12/04/2015   Procedure: BREAST LUMPECTOMY WITH RADIOACTIVE SEED AND SENTINEL LYMPH NODE BIOPSY;  Surgeon: Almond Lint, MD;  Location: [facility];  Service: General;  Laterality: Left;  BREAST LUMPECTOMY WITH RADIOACTIVE SEED AND SENTINEL LYMPH NODE BIOPSY  . LUMBAR MICRODISCECTOMY    . PLACEMENT OF BREAST IMPLANTS       ALLERGIES:  Allergies  Allergen Reactions  . Tramadol Other (See Comments)    Dizziness  . Doxycycline Nausea And Vomiting    Dizziness  . Neomycin Rash  . Tape Rash    Band ai d  flexible , flesh color and fabric on outside  rash     CURRENT MEDICATIONS:  Outpatient Encounter Prescriptions as of 09/16/2016  Medication Sig Note  . anastrozole (ARIMIDEX) 1 MG tablet Take 1 tablet (1 mg total) by mouth daily.   . calcium carbonate (OSCAL) 1500 (600 Ca) MG TABS tablet Take by mouth 2 (two) times daily with a meal.   . fexofenadine (ALLEGRA) 180 MG tablet Take 180 mg by mouth daily.   Marland Kitchen FINACEA 15 % cream Apply 1 application topically 2 (two) times daily.  03/28/2015: Received from: External Pharmacy  . Triamcinolone Acetonide (NASACORT ALLERGY 24HR NA) Place into the nose daily.    No facility-administered encounter medications on file as of 09/16/2016.      ONCOLOGIC FAMILY HISTORY:  Family History  Problem Relation Age of Onset  . Lung cancer Father      GENETIC COUNSELING/TESTING: Not indicated at this time  SOCIAL HISTORY:  CLAUDETTE WERMUTH is married and lives with her [place] in [place].  She has 2 children and they live in [place] and [place].  Ms. Konicki is currently retired.  She denies any current or history of tobacco, alcohol, or illicit drug use.     PHYSICAL EXAMINATION:  Vital Signs:   Vitals:   09/16/16 1037  BP: 133/74  Pulse: 79  Resp: 18  Temp: 98.2 F (36.8 C)   Filed Weights   09/16/16 1037  Weight: 211 lb 3.2 oz (95.8 kg)   General: Well-nourished, well-appearing female in no acute distress.  She is unaccompanied today.   HEENT: Head is normocephalic.  Pupils equal and reactive to light. Conjunctivae clear without exudate.  Sclerae anicteric. Oral mucosa is pink, moist.  Oropharynx is pink without lesions or erythema.  Lymph: No cervical, supraclavicular, or infraclavicular lymphadenopathy noted on palpation.  Cardiovascular: Regular rate and rhythm.Marland Kitchen Respiratory: Clear to auscultation bilaterally. Chest expansion symmetric; breathing non-labored. Breasts: tenderness underneath the left breasts at [facility]  noted, no masses, nodules, skin or nipple changes noted bilaterally GI: Abdomen soft and round; non-tender, non-distended. Bowel sounds normoactive.  GU: Deferred.  Neuro: No focal deficits. Steady gait.  Psych: Mood and affect normal and appropriate for situation.  Extremities: No edema. Skin: Warm and dry.  LABORATORY DATA:  None for this visit.  DIAGNOSTIC IMAGING:  None for this visit.      ASSESSMENT AND PLAN:  Ms.. Hanratty is a pleasant 61 y.o. female with Stage IA left breast invasive ductal carcinoma, ER+/PR+/HER2-, diagnosed in 10/2015, treated with lumpectomy, adjuvant radiation therapy, and anti-estrogen therapy with Anastrozole starting in 02/2016.  She presents to the Survivorship Clinic for our initial meeting and routine follow-up post-completion of treatment for breast cancer.    1. Stage IA left breast cancer:  Ms. Cobern is continuing to recover from definitive treatment for breast cancer. She will follow-up with her medical oncologist, Dr.  Gudena in August, 2018 with history and physical exam per surveillance protocol.  She will continue her anti-estrogen therapy with Anastrozole daily. Thus far, she is tolerating the Anastrozole well well, with minimal side effects. She was instructed to make Dr. Lindi Adie or myself aware if she begins to experience any worsening side effects of the medication and I could see her back in clinic to help manage those side effects, as needed.  Today, a comprehensive survivorship care plan and treatment summary was reviewed with the patient today detailing her breast cancer diagnosis, treatment course, potential late/long-term effects of treatment, appropriate follow-up care with recommendations for the future, and patient education resources.  A copy of this summary, along with a letter will be sent to the patient's primary care provider via mail/fax/In Basket message after today's visit.    2. Left sided chest wall pain: Likely post  treatment related, however, will get rib and chest films to ensure no underlying issues.   3. Bone health:  Given Ms. Ghosh age/history of breast cancer and her current treatment regimen including anti-estrogen therapy with Anastrozole, she is at risk for bone demineralization.  Her last DEXA scan was within the past year and normal.  She was encouraged to increase her consumption of foods rich in calcium, as well as increase her weight-bearing activities.  She was given education on specific activities to promote bone health.  4. Cancer screening:  Due to Ms. Colin history and her age, she should receive screening for skin cancers, colon cancer, and gynecologic cancers.  The information and recommendations are listed on the patient's comprehensive care plan/treatment summary and were reviewed in detail with the patient.    5. Health maintenance and wellness promotion: Ms. Sarria was encouraged to consume 5-7 servings of fruits and vegetables per day. We reviewed the "Nutrition Rainbow" handout, as well as the handout "Take Control of Your Health and Reduce Your Cancer Risk" from the [place].  She was also encouraged to engage in moderate to vigorous exercise for 30 minutes per day most days of the week. We discussed the [employer] fitness program, which is designed for cancer survivors to help them become more physically fit after cancer treatments.  She was instructed to limit her alcohol consumption and continue to abstain from tobacco use.     6. Support services/counseling: It is not uncommon for this period of the patient's cancer care trajectory to be one of many emotions and stressors.  We discussed an opportunity for her to participate in the next session of [facility] ("Finding Your New Normal") support group series designed for patients after they have completed treatment.   Ms. Amison was encouraged to take advantage of our many other support services  programs, support groups, and/or counseling in coping with her new life as a cancer survivor after completing anti-cancer treatment.  She was offered support today through active listening and expressive supportive counseling.  She was given information regarding our available services and encouraged to contact me with any questions or for help enrolling in any of our support group/programs.    Dispo:   -Return to cancer center In August for follow up with Dr. Lindi Adie -Mammogram in July, 2018 when due  -She is welcome to return back to the Survivorship Clinic at any time; no additional follow-up needed at this time.  -Consider referral back to survivorship as a long-term survivor for continued surveillance  A total of (30) minutes of face-to-face time was spent with this  patient with greater than 50% of that time in counseling and care-coordination.   Gardenia Phlegm, NP Survivorship Program [place] [phone]   Note: PRIMARY CARE PROVIDER Deland Pretty, [place] [phone]

## 2016-09-16 ENCOUNTER — Ambulatory Visit (HOSPITAL_COMMUNITY)
Admission: RE | Admit: 2016-09-16 | Discharge: 2016-09-16 | Disposition: A | Discharge status: Home / Self Care | Payer: 59 | Source: Ambulatory Visit | Attending: Adult Health | Admitting: Adult Health

## 2016-09-16 ENCOUNTER — Ambulatory Visit (HOSPITAL_BASED_OUTPATIENT_CLINIC_OR_DEPARTMENT_OTHER): Payer: 59 | Admitting: Adult Health

## 2016-09-16 ENCOUNTER — Telehealth: Payer: Self-pay | Admitting: *Deleted

## 2016-09-16 ENCOUNTER — Encounter: Payer: Self-pay | Admitting: Adult Health

## 2016-09-16 VITALS — BP 133/74 | HR 79 | Temp 98.2°F | Resp 18 | Ht 70.0 in | Wt 211.2 lb

## 2016-09-16 DIAGNOSIS — Z170 Estrogen receptor positive status [ER+]: Secondary | ICD-10-CM | POA: Diagnosis not present

## 2016-09-16 DIAGNOSIS — R0781 Pleurodynia: Secondary | ICD-10-CM

## 2016-09-16 DIAGNOSIS — C50212 Malignant neoplasm of upper-inner quadrant of left female breast: Secondary | ICD-10-CM | POA: Diagnosis not present

## 2016-09-16 DIAGNOSIS — R079 Chest pain, unspecified: Secondary | ICD-10-CM | POA: Diagnosis not present

## 2016-09-16 NOTE — Telephone Encounter (Signed)
Per Mendel Ryder, NP notified patient that X-ray was normal. Patient verbalized understanding, no questions at this time. Patient knows to notify [facility] if pain/discomfort worsens. Patient verbalized appreciation for the call.

## 2016-11-16 DIAGNOSIS — N6320 Unspecified lump in the left breast, unspecified quadrant: Secondary | ICD-10-CM | POA: Diagnosis not present

## 2016-11-16 DIAGNOSIS — Z853 Personal history of malignant neoplasm of breast: Secondary | ICD-10-CM | POA: Diagnosis not present

## 2016-11-25 DIAGNOSIS — T8549XA Other mechanical complication of breast prosthesis and implant, initial encounter: Secondary | ICD-10-CM | POA: Diagnosis not present

## 2016-11-25 DIAGNOSIS — Z923 Personal history of irradiation: Secondary | ICD-10-CM | POA: Diagnosis not present

## 2016-11-25 DIAGNOSIS — Z853 Personal history of malignant neoplasm of breast: Secondary | ICD-10-CM | POA: Diagnosis not present

## 2016-12-08 ENCOUNTER — Ambulatory Visit: Payer: 59 | Admitting: Hematology and Oncology

## 2016-12-08 NOTE — Assessment & Plan Note (Deleted)
Left lumpectomy 12/04/2015: IDC with DCIS, 0.8 cm, margins negative, 0/5 lymph nodes negative, ER 90%, PR 90%, HER-2 negative, Ki-67 3%, T1 cN0 stage IA Oncotype Dx 10 (7% ROR) Adj XRT 01/07/16 to 02/20/16  Treatment Plan: Adjuvant antiestrogen therapy with anastrozole 1 mg daily 5 years started 03/10/2016  Anastrozole Toxicities:  1. Occasional muscle cramps Denies any hot flashes  Patient [place] and is planning to run elevated. RTC in 1 yr for follow up

## 2016-12-16 DIAGNOSIS — N8501 Benign endometrial hyperplasia: Secondary | ICD-10-CM | POA: Diagnosis not present

## 2016-12-16 DIAGNOSIS — C50919 Malignant neoplasm of unspecified site of unspecified female breast: Secondary | ICD-10-CM | POA: Diagnosis not present

## 2016-12-16 DIAGNOSIS — Z01419 Encounter for gynecological examination (general) (routine) without abnormal findings: Secondary | ICD-10-CM | POA: Diagnosis not present

## 2016-12-22 ENCOUNTER — Ambulatory Visit (HOSPITAL_BASED_OUTPATIENT_CLINIC_OR_DEPARTMENT_OTHER): Payer: 59 | Admitting: Hematology and Oncology

## 2016-12-22 ENCOUNTER — Encounter: Payer: Self-pay | Admitting: Hematology and Oncology

## 2016-12-22 DIAGNOSIS — Z170 Estrogen receptor positive status [ER+]: Secondary | ICD-10-CM | POA: Diagnosis not present

## 2016-12-22 DIAGNOSIS — C50212 Malignant neoplasm of upper-inner quadrant of left female breast: Secondary | ICD-10-CM

## 2016-12-22 MED ORDER — ANASTROZOLE 1 MG PO TABS: 1.0000 mg | ORAL_TABLET | Freq: Every day | ORAL | 3 refills | Status: DC

## 2016-12-22 NOTE — Progress Notes (Signed)
Patient Care Team: Deland Pretty, MD as PCP - General (Internal Medicine) Stark Klein, MD as Consulting Physician (General Surgery) Nicholas Lose, MD as Consulting Physician (Hematology and Oncology) Delice Bison, Charlestine Massed, NP as Nurse Practitioner (Hematology and Oncology) Gery Pray, MD as Consulting Physician (Radiation Oncology)  DIAGNOSIS:  Encounter Diagnosis  Name Primary?  . Malignant neoplasm of upper-inner quadrant of left breast in female, estrogen receptor positive ([place])     SUMMARY OF ONCOLOGIC HISTORY:   Breast cancer of upper-inner quadrant of left female breast ([place])   11/13/2015 Initial Diagnosis    Left breast biopsy: Invasive ductal carcinoma with ADH, grade 1, ER 90%, PR 90%, Ki-67 3%, HER-2 negative ratio 1.35 copy #2; left breast spiculated mass 8 mm at 9:00 position, axilla negative, T1 BN 0 stage IA clinical stage      12/04/2015 Surgery    Left lumpectomy [facility]): IDC with DCIS, 0.8 cm, margins negative, 0/5 lymph nodes negative, ER 90%, PR 90%, HER-2 negative, Ki-67 3%, T1 cN0 stage IA      12/04/2015 Oncotype testing    10/7%, low risk      01/07/2016 - 02/20/2016 Radiation Therapy    Adjuvant XRT ([facility]): 1. The Left breast was treated to 50.4 Gy in 28 fractions at 1.8 Gy per fraction. 2. The Left breast was boosted to 10 Gy in 5 fractions at 2 Gy per fraction.       03/10/2016 -  Anti-estrogen oral therapy    Anastrozole 1 mg by mouth daily       CHIEF COMPLIANT: Follow-up on anastrozole therapy  INTERVAL HISTORY: ANALYS Christina Rivera is a 61 year old with above-mentioned history of left breast cancer underwent lumpectomy radiation and is currently on antiestrogen therapy. She is tolerating it extremely well. She denies any pain or discomfort. She denies any lumps or nodules in breast. Recent mammograms were normal.  REVIEW OF SYSTEMS:   Constitutional: Denies fevers, chills or abnormal weight loss Eyes: Denies blurriness of vision Ears,  nose, mouth, throat, and face: Denies mucositis or sore throat Respiratory: Denies cough, dyspnea or wheezes Cardiovascular: Denies palpitation, chest discomfort Gastrointestinal:  Denies nausea, heartburn or change in bowel habits Skin: Denies abnormal skin rashes Lymphatics: Denies new lymphadenopathy or easy bruising Neurological:Denies numbness, tingling or new weaknesses Behavioral/Psych: Mood is stable, no new changes  Extremities: No lower extremity edema Breast:  denies any pain or lumps or nodules in either breasts All other systems were reviewed with the patient and are negative.  I have reviewed the past medical history, past surgical history, social history and family history with the patient and they are unchanged from previous note.  ALLERGIES:  is allergic to tramadol; doxycycline; neomycin; and tape.  MEDICATIONS:  Current Outpatient Prescriptions  Medication Sig Dispense Refill  . anastrozole (ARIMIDEX) 1 MG tablet Take 1 tablet (1 mg total) by mouth daily. 90 tablet 3  . calcium carbonate (OSCAL) 1500 (600 Ca) MG TABS tablet Take by mouth 2 (two) times daily with a meal.    . fexofenadine (ALLEGRA) 180 MG tablet Take 180 mg by mouth daily.    Marland Kitchen FINACEA 15 % cream Apply 1 application topically 2 (two) times daily.     . Triamcinolone Acetonide (NASACORT ALLERGY 24HR NA) Place into the nose daily.     No current facility-administered medications for this visit.     PHYSICAL EXAMINATION: ECOG PERFORMANCE STATUS: 1 - Symptomatic but completely ambulatory  Vitals:   12/22/16 1434  BP: (!) 141/67  Pulse: 70  Resp: 18  Temp: 98.2 F (36.8 C)  SpO2: 99%   Filed Weights   12/22/16 1434  Weight: 210 lb 3.2 oz (95.3 kg)    GENERAL:alert, no distress and comfortable SKIN: skin color, texture, turgor are normal, no rashes or significant lesions EYES: normal, Conjunctiva are pink and non-injected, sclera clear OROPHARYNX:no exudate, no erythema and lips, buccal  mucosa, and tongue normal  NECK: supple, thyroid normal size, non-tender, without nodularity LYMPH:  no palpable lymphadenopathy in the cervical, axillary or inguinal LUNGS: clear to auscultation and percussion with normal breathing effort HEART: regular rate & rhythm and no murmurs and no lower extremity edema ABDOMEN:abdomen soft, non-tender and normal bowel sounds MUSCULOSKELETAL:no cyanosis of digits and no clubbing  NEURO: alert & oriented x 3 with fluent speech, no focal motor/sensory deficits EXTREMITIES: No lower extremity edema  LABORATORY DATA:  I have reviewed the data as listed   Chemistry      Component Value Date/Time   NA 141 11/20/2015 1225   K 3.8 11/20/2015 1225   CL 102 09/29/2006 1047   CO2 25 11/20/2015 1225   BUN 17.3 11/20/2015 1225   CREATININE 0.8 11/20/2015 1225      Component Value Date/Time   CALCIUM 9.5 11/20/2015 1225   ALKPHOS 55 11/20/2015 1225   AST 18 11/20/2015 1225   ALT 24 11/20/2015 1225   BILITOT 0.37 11/20/2015 1225       Lab Results  Component Value Date   WBC 9.1 11/20/2015   HGB 14.2 11/20/2015   HCT 43.5 11/20/2015   MCV 88.0 11/20/2015   PLT 291 11/20/2015   NEUTROABS 5.8 11/20/2015    ASSESSMENT & PLAN:  Breast cancer of upper-inner quadrant of left female breast ([place]) Left lumpectomy 12/04/2015: IDC with DCIS, 0.8 cm, margins negative, 0/5 lymph nodes negative, ER 90%, PR 90%, HER-2 negative, Ki-67 3%, T1 cN0 stage IA Oncotype Dx 10 (7% ROR) Adj XRT 01/07/16 to 02/20/16  Treatment Plan: Adjuvant antiestrogen therapy with anastrozole 1 mg daily 5 years started 03/10/2016  Anastrozole Toxicities:  1. Occasional muscle cramps Denies any hot flashes  [place] and is [place] completely. RTC in one year for follow up   I spent 15 minutes talking to the patient of which more than half was spent in counseling and coordination of care.  No orders of the defined types were placed  in this encounter.  The patient has a good understanding of the overall plan. she agrees with it. she will call with any problems that may develop before the next visit here.   Rulon Eisenmenger, MD 12/22/16

## 2016-12-22 NOTE — Assessment & Plan Note (Signed)
Left lumpectomy 12/04/2015: IDC with DCIS, 0.8 cm, margins negative, 0/5 lymph nodes negative, ER 90%, PR 90%, HER-2 negative, Ki-67 3%, T1 cN0 stage IA Oncotype Dx 10 (7% ROR) Adj XRT 01/07/16 to 02/20/16  Treatment Plan: Adjuvant antiestrogen therapy with anastrozole 1 mg daily 5 years started 03/10/2016  Anastrozole Toxicities:  1. Occasional muscle cramps Denies any hot flashes  Patient is planning to spend the next 3 months on a beach location. [place]  RTC in one year for follow up

## 2016-12-28 DIAGNOSIS — Z853 Personal history of malignant neoplasm of breast: Secondary | ICD-10-CM | POA: Diagnosis not present

## 2016-12-28 DIAGNOSIS — Z923 Personal history of irradiation: Secondary | ICD-10-CM | POA: Diagnosis not present

## 2016-12-28 DIAGNOSIS — Z9882 Breast implant status: Secondary | ICD-10-CM | POA: Diagnosis not present

## 2016-12-28 NOTE — H&P (Signed)
  Subjective:     Patient ID: Christina Rivera is a 61 y.o. female.  Follow-up   Here for follow up discussion prior to planned removal replacement implants, capsulectomies. Presented following recent screening MMG with bilateral implant rupture noted. Patient diagnosed with left breast ca 2017 and underwent left lumpectomy Barry Dienes), SLN. Final pathology 0.8 cm IDC with DCIS, 0/5 LN, ER/PR+ HER-2 -. Oncotype low risk. Completed adjuvant radiation 02/20/16. On Arimidex.  She has a history of augmentation in 07/1983 by Dr. Claybon Jabs in [place], [place] silicone 865 ml low profile per patient.   Prior to augmentation reports did not fill out A cup. Current 38 C, happy with this. Notes had pain over right breast and increased firmness and now finding this on left. Wt- reports 55 lb gain since time of augmentation.  MMG/US report: "MMG notes extracapsular silicone collection RIGHT breast over LIQ. There is oval high density mass in the LEFT breast, central to the nipple middle depth. F/u US demonstrated hypoechoic lesions right lower breast consistent with silicone. A 1.9 cm lobulated mass in left breast at 12 o clock with contour implant consistent with implant rupture."      Objective:   Physical Exam  Cardiovascular: Normal rate, regular rhythm and normal heart sounds.   Pulmonary/Chest: Effort normal and breath sounds normal.   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 Reviewed complete capsulectomy to aid with silicone removal. Counseled intraoperatively guided by palpable masses with regards to finding  silicone and the granulomas and cannot assure her all silicone will be removed.   She desires replacement implants. Reviewed her implants are subglandular. Overall risk contracture higher in this position. Reviewed radiation history will also place her at higher risk contracture, wound healing problems. I recommend switch to dual plane position to try to prevent this. However the radiated breast will always have higher risk contracture with implants. She agrees to site change.  We discussed that they initially decided on silicone implants for soft feel. I agree that that generation implants quite soft. Reviewed current 4th and 5th generation implants are much firmer. Reviewed HCG and capacity filled implants and goal to reduce rippling. Discussed risks rupture, MRI surveillance for silicone. Plan saline implants  Additional risks including but not limited to rupture, wound healing problems, extrusion, asymmetry, unacceptable cosmetic result, DVT/PE, cardiopulmonary complications, seroma, hematoma, damage to deeper structures reviewed.   Plan OP surgery, she will have drains. Rx for Norco and Bactrim given.  Irene Limbo, MD [facility] [phone], pin [phone]

## 2017-01-01 DIAGNOSIS — H0012 Chalazion right lower eyelid: Secondary | ICD-10-CM | POA: Diagnosis not present

## 2017-01-11 ENCOUNTER — Encounter (HOSPITAL_BASED_OUTPATIENT_CLINIC_OR_DEPARTMENT_OTHER): Payer: Self-pay | Admitting: *Deleted

## 2017-01-12 NOTE — Progress Notes (Signed)
Patient given Ensure with instruction to drink by 0415 DOS. Pt verbalized understanding without questions.

## 2017-01-15 ENCOUNTER — Ambulatory Visit (HOSPITAL_BASED_OUTPATIENT_CLINIC_OR_DEPARTMENT_OTHER): Payer: 59 | Admitting: Certified Registered Nurse Anesthetist

## 2017-01-15 ENCOUNTER — Encounter (HOSPITAL_BASED_OUTPATIENT_CLINIC_OR_DEPARTMENT_OTHER): Payer: Self-pay | Admitting: Certified Registered Nurse Anesthetist

## 2017-01-15 ENCOUNTER — Ambulatory Visit (HOSPITAL_BASED_OUTPATIENT_CLINIC_OR_DEPARTMENT_OTHER)
Admission: RE | Admit: 2017-01-15 | Discharge: 2017-01-15 | Disposition: A | Discharge status: Home / Self Care | Payer: 59 | Source: Ambulatory Visit | Attending: Plastic Surgery | Admitting: Plastic Surgery

## 2017-01-15 ENCOUNTER — Encounter (HOSPITAL_BASED_OUTPATIENT_CLINIC_OR_DEPARTMENT_OTHER)
Admission: RE | Disposition: A | Discharge status: Home / Self Care | Payer: Self-pay | Source: Ambulatory Visit | Attending: Plastic Surgery

## 2017-01-15 DIAGNOSIS — T85898A Other specified complication of other internal prosthetic devices, implants and grafts, initial encounter: Secondary | ICD-10-CM | POA: Diagnosis not present

## 2017-01-15 DIAGNOSIS — Y812 Prosthetic and other implants, materials and accessory general- and plastic-surgery devices associated with adverse incidents: Secondary | ICD-10-CM | POA: Insufficient documentation

## 2017-01-15 DIAGNOSIS — Z853 Personal history of malignant neoplasm of breast: Secondary | ICD-10-CM | POA: Insufficient documentation

## 2017-01-15 DIAGNOSIS — T8544XA Capsular contracture of breast implant, initial encounter: Secondary | ICD-10-CM | POA: Insufficient documentation

## 2017-01-15 DIAGNOSIS — Y929 Unspecified place or not applicable: Secondary | ICD-10-CM | POA: Insufficient documentation

## 2017-01-15 DIAGNOSIS — T8543XA Leakage of breast prosthesis and implant, initial encounter: Secondary | ICD-10-CM | POA: Diagnosis not present

## 2017-01-15 DIAGNOSIS — Z923 Personal history of irradiation: Secondary | ICD-10-CM | POA: Diagnosis not present

## 2017-01-15 HISTORY — PX: CAPSULECTOMY: SHX5381

## 2017-01-15 HISTORY — PX: BREAST IMPLANT EXCHANGE: SHX6296

## 2017-01-15 HISTORY — DX: Snoring: R06.83

## 2017-01-15 SURGERY — REPLACEMENT, IMPLANT, BREAST
Anesthesia: General | Site: Breast | Laterality: Bilateral

## 2017-01-15 MED ORDER — LIDOCAINE HCL (CARDIAC) 20 MG/ML IV SOLN
INTRAVENOUS | Status: DC | PRN
  Administered 2017-01-15: 50 mg via INTRAVENOUS

## 2017-01-15 MED ORDER — HYDROMORPHONE HCL 1 MG/ML IJ SOLN
INTRAMUSCULAR | Status: AC
  Filled 2017-01-15: qty 0.5

## 2017-01-15 MED ORDER — HEPARIN SODIUM (PORCINE) 5000 UNIT/ML IJ SOLN
INTRAMUSCULAR | Status: AC
  Filled 2017-01-15: qty 1

## 2017-01-15 MED ORDER — MIDAZOLAM HCL 2 MG/2ML IJ SOLN
INTRAMUSCULAR | Status: AC
  Filled 2017-01-15: qty 2

## 2017-01-15 MED ORDER — CEFAZOLIN SODIUM-DEXTROSE 2-4 GM/100ML-% IV SOLN
INTRAVENOUS | Status: AC
  Filled 2017-01-15: qty 100

## 2017-01-15 MED ORDER — ONDANSETRON HCL 4 MG/2ML IJ SOLN
INTRAMUSCULAR | Status: DC | PRN
  Administered 2017-01-15: 4 mg via INTRAVENOUS

## 2017-01-15 MED ORDER — SUGAMMADEX SODIUM 200 MG/2ML IV SOLN
INTRAVENOUS | Status: DC | PRN
  Administered 2017-01-15: 200 mg via INTRAVENOUS

## 2017-01-15 MED ORDER — HEPARIN SODIUM (PORCINE) 5000 UNIT/ML IJ SOLN
5000.0000 [IU] | Freq: Once | INTRAMUSCULAR | Status: AC
  Administered 2017-01-15: 5000 [IU] via SUBCUTANEOUS

## 2017-01-15 MED ORDER — SCOPOLAMINE 1 MG/3DAYS TD PT72: 1.0000 | MEDICATED_PATCH | Freq: Once | TRANSDERMAL | Status: DC | PRN

## 2017-01-15 MED ORDER — PROMETHAZINE HCL 25 MG/ML IJ SOLN
6.2500 mg | INTRAMUSCULAR | Status: DC | PRN
  Administered 2017-01-15: 6.25 mg via INTRAVENOUS

## 2017-01-15 MED ORDER — FENTANYL CITRATE (PF) 100 MCG/2ML IJ SOLN
50.0000 ug | INTRAMUSCULAR | Status: AC | PRN
  Administered 2017-01-15 (×4): 50 ug via INTRAVENOUS

## 2017-01-15 MED ORDER — SUCCINYLCHOLINE CHLORIDE 20 MG/ML IJ SOLN
INTRAMUSCULAR | Status: DC | PRN
  Administered 2017-01-15: 100 mg via INTRAVENOUS

## 2017-01-15 MED ORDER — BUPIVACAINE-EPINEPHRINE (PF) 0.25% -1:200000 IJ SOLN
INTRAMUSCULAR | Status: AC
  Filled 2017-01-15: qty 30

## 2017-01-15 MED ORDER — EPHEDRINE SULFATE 50 MG/ML IJ SOLN
INTRAMUSCULAR | Status: DC | PRN
  Administered 2017-01-15 (×2): 10 mg via INTRAVENOUS

## 2017-01-15 MED ORDER — PROPOFOL 10 MG/ML IV BOLUS
INTRAVENOUS | Status: AC
  Filled 2017-01-15: qty 20

## 2017-01-15 MED ORDER — DEXAMETHASONE SODIUM PHOSPHATE 10 MG/ML IJ SOLN
INTRAMUSCULAR | Status: DC | PRN
  Administered 2017-01-15: 10 mg via INTRAVENOUS

## 2017-01-15 MED ORDER — MORPHINE SULFATE (PF) 10 MG/ML IV SOLN
INTRAVENOUS | Status: AC
  Filled 2017-01-15: qty 1

## 2017-01-15 MED ORDER — GABAPENTIN 300 MG PO CAPS
ORAL_CAPSULE | ORAL | Status: AC
  Filled 2017-01-15: qty 1

## 2017-01-15 MED ORDER — MORPHINE SULFATE 10 MG/ML IJ SOLN
INTRAMUSCULAR | Status: DC | PRN
  Administered 2017-01-15 (×2): 5 mg via INTRAVENOUS

## 2017-01-15 MED ORDER — CHLORHEXIDINE GLUCONATE CLOTH 2 % EX PADS: 6.0000 | MEDICATED_PAD | Freq: Once | CUTANEOUS | Status: DC

## 2017-01-15 MED ORDER — BUPIVACAINE-EPINEPHRINE 0.25% -1:200000 IJ SOLN
INTRAMUSCULAR | Status: DC | PRN
  Administered 2017-01-15: 30 mL

## 2017-01-15 MED ORDER — FENTANYL CITRATE (PF) 100 MCG/2ML IJ SOLN
INTRAMUSCULAR | Status: AC
  Filled 2017-01-15: qty 2

## 2017-01-15 MED ORDER — LIDOCAINE 2% (20 MG/ML) 5 ML SYRINGE
INTRAMUSCULAR | Status: AC
  Filled 2017-01-15: qty 5

## 2017-01-15 MED ORDER — GABAPENTIN 300 MG PO CAPS
300.0000 mg | ORAL_CAPSULE | ORAL | Status: AC
  Administered 2017-01-15: 300 mg via ORAL

## 2017-01-15 MED ORDER — EPHEDRINE 5 MG/ML INJ
INTRAVENOUS | Status: AC
  Filled 2017-01-15: qty 10

## 2017-01-15 MED ORDER — ROCURONIUM BROMIDE 100 MG/10ML IV SOLN
INTRAVENOUS | Status: DC | PRN
  Administered 2017-01-15: 10 mg via INTRAVENOUS
  Administered 2017-01-15: 40 mg via INTRAVENOUS
  Administered 2017-01-15: 10 mg via INTRAVENOUS

## 2017-01-15 MED ORDER — SODIUM CHLORIDE 0.9 % IV SOLN
INTRAVENOUS | Status: DC | PRN
  Administered 2017-01-15: 1000 mL

## 2017-01-15 MED ORDER — FENTANYL CITRATE (PF) 100 MCG/2ML IJ SOLN
25.0000 ug | INTRAMUSCULAR | Status: DC | PRN
  Administered 2017-01-15 (×3): 50 ug via INTRAVENOUS

## 2017-01-15 MED ORDER — HYDROMORPHONE HCL 1 MG/ML IJ SOLN
0.5000 mg | Freq: Once | INTRAMUSCULAR | Status: AC
  Administered 2017-01-15: 0.5 mg via INTRAVENOUS

## 2017-01-15 MED ORDER — MIDAZOLAM HCL 2 MG/2ML IJ SOLN
1.0000 mg | INTRAMUSCULAR | Status: DC | PRN
  Administered 2017-01-15: 2 mg via INTRAVENOUS

## 2017-01-15 MED ORDER — DEXAMETHASONE SODIUM PHOSPHATE 10 MG/ML IJ SOLN
INTRAMUSCULAR | Status: AC
  Filled 2017-01-15: qty 1

## 2017-01-15 MED ORDER — ONDANSETRON HCL 4 MG/2ML IJ SOLN
INTRAMUSCULAR | Status: AC
  Filled 2017-01-15: qty 2

## 2017-01-15 MED ORDER — CELECOXIB 200 MG PO CAPS
ORAL_CAPSULE | ORAL | Status: AC
  Filled 2017-01-15: qty 2

## 2017-01-15 MED ORDER — SUCCINYLCHOLINE CHLORIDE 200 MG/10ML IV SOSY
PREFILLED_SYRINGE | INTRAVENOUS | Status: AC
  Filled 2017-01-15: qty 10

## 2017-01-15 MED ORDER — SUGAMMADEX SODIUM 200 MG/2ML IV SOLN
INTRAVENOUS | Status: AC
  Filled 2017-01-15: qty 2

## 2017-01-15 MED ORDER — LACTATED RINGERS IV SOLN
INTRAVENOUS | Status: DC
  Administered 2017-01-15 (×4): via INTRAVENOUS

## 2017-01-15 MED ORDER — PROPOFOL 10 MG/ML IV BOLUS
INTRAVENOUS | Status: DC | PRN
  Administered 2017-01-15: 200 mg via INTRAVENOUS

## 2017-01-15 MED ORDER — BUPIVACAINE HCL (PF) 0.25 % IJ SOLN
INTRAMUSCULAR | Status: AC
  Filled 2017-01-15: qty 30

## 2017-01-15 MED ORDER — ACETAMINOPHEN 500 MG PO TABS
ORAL_TABLET | ORAL | Status: AC
  Filled 2017-01-15: qty 2

## 2017-01-15 MED ORDER — CEFAZOLIN SODIUM-DEXTROSE 2-4 GM/100ML-% IV SOLN
2.0000 g | INTRAVENOUS | Status: AC
  Administered 2017-01-15: 2 g via INTRAVENOUS

## 2017-01-15 MED ORDER — CELECOXIB 400 MG PO CAPS
400.0000 mg | ORAL_CAPSULE | ORAL | Status: AC
  Administered 2017-01-15: 400 mg via ORAL

## 2017-01-15 MED ORDER — PROMETHAZINE HCL 25 MG/ML IJ SOLN
INTRAMUSCULAR | Status: AC
  Filled 2017-01-15: qty 1

## 2017-01-15 MED ORDER — ACETAMINOPHEN 500 MG PO TABS
1000.0000 mg | ORAL_TABLET | ORAL | Status: AC
  Administered 2017-01-15: 1000 mg via ORAL

## 2017-01-15 SURGICAL SUPPLY — 75 items
ADH SKN CLS APL DERMABOND .7 (GAUZE/BANDAGES/DRESSINGS) ×2
BAG DECANTER FOR FLEXI CONT (MISCELLANEOUS) ×3 IMPLANT
BINDER BREAST LRG (GAUZE/BANDAGES/DRESSINGS) IMPLANT
BINDER BREAST MEDIUM (GAUZE/BANDAGES/DRESSINGS) IMPLANT
BINDER BREAST XLRG (GAUZE/BANDAGES/DRESSINGS) ×2 IMPLANT
BINDER BREAST XXLRG (GAUZE/BANDAGES/DRESSINGS) IMPLANT
BLADE SURG 10 STRL SS (BLADE) ×5 IMPLANT
BNDG GAUZE ELAST 4 BULKY (GAUZE/BANDAGES/DRESSINGS) ×6 IMPLANT
CANISTER SUCT 1200ML W/VALVE (MISCELLANEOUS) ×5 IMPLANT
CHLORAPREP W/TINT 26ML (MISCELLANEOUS) ×5 IMPLANT
COVER BACK TABLE 60X90IN (DRAPES) ×3 IMPLANT
COVER MAYO STAND STRL (DRAPES) ×3 IMPLANT
DECANTER SPIKE VIAL GLASS SM (MISCELLANEOUS) IMPLANT
DERMABOND ADVANCED (GAUZE/BANDAGES/DRESSINGS) ×4
DERMABOND ADVANCED .7 DNX12 (GAUZE/BANDAGES/DRESSINGS) ×1 IMPLANT
DRAIN CHANNEL 15F RND FF W/TCR (WOUND CARE) ×5 IMPLANT
DRAPE INCISE IOBAN 66X45 STRL (DRAPES) ×2 IMPLANT
DRAPE TOP ARMCOVERS (MISCELLANEOUS) ×3 IMPLANT
DRAPE U-SHAPE 76X120 STRL (DRAPES) ×3 IMPLANT
DRAPE UTILITY XL STRL (DRAPES) IMPLANT
DRSG PAD ABDOMINAL 8X10 ST (GAUZE/BANDAGES/DRESSINGS) ×6 IMPLANT
ELECT BLADE 4.0 EZ CLEAN MEGAD (MISCELLANEOUS) ×3
ELECT COATED BLADE 2.86 ST (ELECTRODE) ×3 IMPLANT
ELECT REM PT RETURN 9FT ADLT (ELECTROSURGICAL) ×3
ELECTRODE BLDE 4.0 EZ CLN MEGD (MISCELLANEOUS) ×1 IMPLANT
ELECTRODE REM PT RTRN 9FT ADLT (ELECTROSURGICAL) ×1 IMPLANT
EVACUATOR SILICONE 100CC (DRAIN) ×4 IMPLANT
GLOVE BIO SURGEON STRL SZ 6 (GLOVE) ×9 IMPLANT
GLOVE BIO SURGEON STRL SZ 6.5 (GLOVE) ×1 IMPLANT
GLOVE BIO SURGEONS STRL SZ 6.5 (GLOVE) ×1
GLOVE BIOGEL PI IND STRL 7.0 (GLOVE) IMPLANT
GLOVE BIOGEL PI INDICATOR 7.0 (GLOVE) ×2
GLOVE EXAM NITRILE MD LF STRL (GLOVE) ×2 IMPLANT
GLOVE SURG SS PI 7.0 STRL IVOR (GLOVE) ×4 IMPLANT
GOWN STRL REUS W/ TWL LRG LVL3 (GOWN DISPOSABLE) ×2 IMPLANT
GOWN STRL REUS W/TWL LRG LVL3 (GOWN DISPOSABLE) ×12
IMPL BREAST SALINE 270CC (Breast) IMPLANT
IMPLANT BREAST SALINE 270CC (Breast) ×6 IMPLANT
IV NS 1000ML (IV SOLUTION) ×3
IV NS 1000ML BAXH (IV SOLUTION) IMPLANT
IV NS 500ML (IV SOLUTION)
IV NS 500ML BAXH (IV SOLUTION) ×1 IMPLANT
KIT FILL SYSTEM UNIVERSAL (SET/KITS/TRAYS/PACK) IMPLANT
MARKER SKIN DUAL TIP RULER LAB (MISCELLANEOUS) IMPLANT
NDL HYPO 25X1 1.5 SAFETY (NEEDLE) IMPLANT
NEEDLE HYPO 25X1 1.5 SAFETY (NEEDLE) ×3 IMPLANT
PACK BASIN DAY SURGERY FS (CUSTOM PROCEDURE TRAY) ×3 IMPLANT
PENCIL BUTTON HOLSTER BLD 10FT (ELECTRODE) ×3 IMPLANT
PIN SAFETY STERILE (MISCELLANEOUS) ×3 IMPLANT
SHEET MEDIUM DRAPE 40X70 STRL (DRAPES) ×3 IMPLANT
SIZER BREAST SGL USE 240CC (SIZER) ×3
SIZER BREAST SGL USE 270CC (SIZER) ×3
SIZER BRST SGL USE 240CC (SIZER) IMPLANT
SIZER BRST SGL USE 270CC (SIZER) IMPLANT
SLEEVE SCD COMPRESS KNEE MED (MISCELLANEOUS) ×3 IMPLANT
SPONGE LAP 18X18 X RAY DECT (DISPOSABLE) ×8 IMPLANT
STAPLER VISISTAT 35W (STAPLE) ×3 IMPLANT
SUT ETHILON 2 0 FS 18 (SUTURE) ×4 IMPLANT
SUT MNCRL AB 4-0 PS2 18 (SUTURE) ×4 IMPLANT
SUT PDS 3-0 CT2 (SUTURE)
SUT PDS AB 2-0 CT2 27 (SUTURE) ×4 IMPLANT
SUT PDS II 3-0 CT2 27 ABS (SUTURE) IMPLANT
SUT VIC AB 3-0 PS1 18 (SUTURE)
SUT VIC AB 3-0 PS1 18XBRD (SUTURE) IMPLANT
SUT VIC AB 3-0 SH 27 (SUTURE) ×6
SUT VIC AB 3-0 SH 27X BRD (SUTURE) ×1 IMPLANT
SUT VICRYL 4-0 PS2 18IN ABS (SUTURE) ×4 IMPLANT
SYR 50ML LL SCALE MARK (SYRINGE) IMPLANT
SYR BULB IRRIGATION 50ML (SYRINGE) ×5 IMPLANT
SYR CONTROL 10ML LL (SYRINGE) ×2 IMPLANT
TOWEL OR 17X24 6PK STRL BLUE (TOWEL DISPOSABLE) ×8 IMPLANT
TUBE CONNECTING 20'X1/4 (TUBING) ×2
TUBE CONNECTING 20X1/4 (TUBING) ×3 IMPLANT
UNDERPAD 30X30 (UNDERPADS AND DIAPERS) ×6 IMPLANT
YANKAUER SUCT BULB TIP NO VENT (SUCTIONS) ×3 IMPLANT

## 2017-01-15 NOTE — Anesthesia Procedure Notes (Signed)
Procedure Name: Intubation Date/Time: 01/15/2017 7:25 AM Performed by: Bufford Spikes Pre-anesthesia Checklist: Patient identified, Emergency Drugs available, Suction available and Patient being monitored Patient Re-evaluated:Patient Re-evaluated prior to induction Oxygen Delivery Method: Circle system utilized Preoxygenation: Pre-oxygenation with 100% oxygen Induction Type: IV induction Ventilation: Mask ventilation without difficulty Laryngoscope Size: Miller and 2 Grade View: Grade II Tube type: Oral Tube size: 7.0 mm Number of attempts: 1 Airway Equipment and Method: Stylet Placement Confirmation: ETT inserted through vocal cords under direct vision,  positive ETCO2 and breath sounds checked- equal and bilateral Secured at: 21 cm Tube secured with: Tape Dental Injury: Teeth and Oropharynx as per pre-operative assessment

## 2017-01-15 NOTE — Transfer of Care (Signed)
Immediate Anesthesia Transfer of Care Note  Patient: Christina Rivera  Procedure(s) Performed: Procedure(s): BREAST IMPLANT EXCHANGE (Bilateral) COMPLETE CAPSULECTOMY (Bilateral)  Patient Location: PACU  Anesthesia Type:General  Level of Consciousness: awake, alert  and oriented  Airway & Oxygen Therapy: Patient Spontanous Breathing and Patient connected to face mask oxygen  Post-op Assessment: Report given to RN and Post -op Vital signs reviewed and stable  Post vital signs: Reviewed and stable  Last Vitals:  Vitals:   01/15/17 0642  BP: (!) 144/70  Pulse: 87  Resp: 20  Temp: 37.1 C  SpO2: 100%    Last Pain:  Vitals:   01/15/17 0642  TempSrc: Oral         Complications: No apparent anesthesia complications

## 2017-01-15 NOTE — Interval H&P Note (Signed)
History and Physical Interval Note:  01/15/2017 7:04 AM  Christina Rivera  has presented today for surgery, with the diagnosis of BREAST MASS, HISTORY OF BREAST CANCER   The various methods of treatment have been discussed with the patient and family. After consideration of risks, benefits and other options for treatment, the patient has consented to  Removal bilateral ruptured silicone implants, capsulectomies, placement saline implants  as a surgical intervention .  The patient's history has been reviewed, patient examined, no change in status, stable for surgery.  I have reviewed the patient's chart and labs.  Questions were answered to the patient's satisfaction.     Joron Velis

## 2017-01-15 NOTE — Anesthesia Postprocedure Evaluation (Signed)
Anesthesia Post Note  Patient: Christina Rivera  Procedure(s) Performed: Procedure(s) (LRB): BREAST IMPLANT EXCHANGE (Bilateral) COMPLETE CAPSULECTOMY (Bilateral)     Patient location during evaluation: PACU Anesthesia Type: General Level of consciousness: awake and alert Pain management: pain level controlled Vital Signs Assessment: post-procedure vital signs reviewed and stable Respiratory status: spontaneous breathing, nonlabored ventilation and respiratory function stable Cardiovascular status: blood pressure returned to baseline and stable Postop Assessment: no apparent nausea or vomiting Anesthetic complications: no    Last Vitals:  Vitals:   01/15/17 1305 01/15/17 1330  BP:    Pulse:    Resp:    Temp:    SpO2: 97% 92%    Last Pain:  Vitals:   01/15/17 1330  TempSrc:   PainSc: 2                  Catalina Gravel

## 2017-01-15 NOTE — Discharge Instructions (Signed)
Post Anesthesia Home Care Instructions  Activity: Get plenty of rest for the remainder of the day. A responsible individual must stay with you for 24 hours following the procedure.  For the next 24 hours, DO NOT: -Drive a car -Paediatric nurse -Drink alcoholic beverages -Take any medication unless instructed by your physician -Make any legal decisions or sign important papers.  Meals: Start with liquid foods such as gelatin or soup. Progress to regular foods as tolerated. Avoid greasy, spicy, heavy foods. If nausea and/or vomiting occur, drink only clear liquids until the nausea and/or vomiting subsides. Call your physician if vomiting continues.  Special Instructions/Symptoms: Your throat may feel dry or sore from the anesthesia or the breathing tube placed in your throat during surgery. If this causes discomfort, gargle with warm salt water. The discomfort should disappear within 24 hours.  If you had a scopolamine patch placed behind your ear for the management of post- operative nausea and/or vomiting:  1. The medication in the patch is effective for 72 hours, after which it should be removed.  Wrap patch in a tissue and discard in the trash. Wash hands thoroughly with soap and water. 2. You may remove the patch earlier than 72 hours if you experience unpleasant side effects which may include dry mouth, dizziness or visual disturbances. 3. Avoid touching the patch. Wash your hands with soap and water after contact with the patch.       JP Drain [employer] this sheet to all of your post-operative appointments while you have your drains.  Please measure your drains by CC's or ML's.  Make sure you drain and measure your JP Drains 2 or 3 times per day.  At the end of each day, add up totals for the left side and add up totals for the right side.    ( 9 am )     ( 3 pm )        ( 9 pm )                Date L  R  L  R  L  R  Total L/R                                                                                                                                                                                Call your surgeon if you experience:   1.  Fever over 101.0. 2.  Inability to urinate. 3.  Nausea and/or vomiting. 4.  Extreme swelling or bruising at the surgical site. 5.  Continued bleeding from the incision. 6.  Increased pain, redness or drainage from the incision. 7.  Problems related to your  pain medication. 8.  Any problems and/or concerns

## 2017-01-15 NOTE — Transfer of Care (Signed)
Immediate Anesthesia Transfer of Care Note  Patient: Christina Rivera  Procedure(s) Performed: Procedure(s): BREAST IMPLANT EXCHANGE (Bilateral) COMPLETE CAPSULECTOMY (Bilateral)  Patient Location: PACU  Anesthesia Type:General  Level of Consciousness: awake, alert  and oriented  Airway & Oxygen Therapy: Patient Spontanous Breathing and Patient connected to face mask oxygen  Post-op Assessment: Report given to RN and Post -op Vital signs reviewed and stable  Post vital signs: Reviewed and stable  Last Vitals:  Vitals:   01/15/17 0642 01/15/17 1030  BP: (!) 144/70 (!) 147/68  Pulse: 87 95  Resp: 20 (!) 27  Temp: 37.1 C   SpO2: 100% 99%    Last Pain:  Vitals:   01/15/17 0642  TempSrc: Oral         Complications: No apparent anesthesia complications

## 2017-01-15 NOTE — Anesthesia Preprocedure Evaluation (Signed)
Anesthesia Evaluation  Patient identified by MRN, date of birth, ID band Patient awake    Reviewed: Allergy & Precautions, NPO status , Patient's Chart, lab work & pertinent test results  Airway Mallampati: II  TM Distance: >3 FB Neck ROM: Full    Dental  (+) Teeth Intact, Dental Advisory Given   Pulmonary neg pulmonary ROS,    Pulmonary exam normal breath sounds clear to auscultation       Cardiovascular negative cardio ROS Normal cardiovascular exam Rhythm:Regular Rate:Normal     Neuro/Psych PSYCHIATRIC DISORDERS Anxiety negative neurological ROS     GI/Hepatic negative GI ROS, Neg liver ROS,   Endo/Other  negative endocrine ROS  Renal/GU negative Renal ROS     Musculoskeletal  (+) Arthritis , Osteoarthritis,    Abdominal   Peds  Hematology negative hematology ROS (+)   Anesthesia Other Findings Day of surgery medications reviewed with the patient.  H/o left breast cancer  Reproductive/Obstetrics                             Anesthesia Physical Anesthesia Plan  ASA: II  Anesthesia Plan: General   Post-op Pain Management:    Induction: Intravenous  PONV Risk Score and Plan: 3 and Ondansetron, Dexamethasone and Midazolam  Airway Management Planned: LMA  Additional Equipment:   Intra-op Plan:   Post-operative Plan: Extubation in OR  Informed Consent: I have reviewed the patients History and Physical, chart, labs and discussed the procedure including the risks, benefits and alternatives for the proposed anesthesia with the patient or authorized representative who has indicated his/her understanding and acceptance.   Dental advisory given  Plan Discussed with: CRNA  Anesthesia Plan Comments: (Risks/benefits of general anesthesia discussed with patient including risk of damage to teeth, lips, gum, and tongue, nausea/vomiting, allergic reactions to medications, and the  possibility of heart attack, stroke and death.  All patient questions answered.  Patient wishes to proceed.)        Anesthesia Quick Evaluation

## 2017-01-15 NOTE — Op Note (Signed)
Operative Note   DATE OF OPERATION: 9.28.18  LOCATION: [facility]-outpatient  SURGICAL DIVISION: Plastic Surgery  PREOPERATIVE DIAGNOSES:  1. Bilateral extracapsular rupture silicon implants 2. Bilateral capsular contracture 3. History left breast cancer 4. History therapeutic radiation 5. History augmentation mammaplasty  POSTOPERATIVE DIAGNOSES:  same  PROCEDURE:  1. Removal bilateral silicone implants 2. Bilateral complete capsulectomies 3. Saline augmentation bilateral  SURGEON: Irene Limbo MD MBA  ASSISTANT: none  ANESTHESIA:  General.   EBL: 50 ml  COMPLICATIONS: None immediate.   INDICATIONS FOR PROCEDURE:  The patient, Christina Rivera, is a 61 y.o. female born on 02/04/1956, is here for treatment bilateral extracapsular rupture noted of subglandular silicone implants noted on screening MMG. Patient has history of left breast cancer and has undergone prior lumpectomy and adjuvant radiation in 2017.   FINDINGS: Bilateral extracapsular rupture with associated silicone granulomas of breast tissue and thick calcified capsules noted bilateral, greater on left. Implants smooth round with label "[employer] 250." Natrelle Smooth Round Saline Moderate Profile 270 ml implants filled to 300 ml placed bilateral. REF 68-270 RIGHT SN [id number] LEFT SN [id number]  DESCRIPTION OF PROCEDURE:  The patient's operative site was marked with the patient in the preoperative area. The patient was taken to the operating room. SCDs were placed and IV antibiotics were given. The patient's operative site was prepped and draped in a sterile fashion. A time out was performed and all information was confirmed to be correct.  Nipple areolar shields placed bilateral. Incision made in right inframammary scar. Incision carried through superficial fascia and breast tissue to implant capsule. Dissection completed over surface of capsule. Gross extracapsular rupture with silicone granuloma forming over medial  breast and this was excised with capsule. Implant removed and remainder capsule excised. The cavity was thoroughly irrigated with saline and hemostasis obtained. The subglandular pocket was closed with interrupted 2-0 PDS from breast tissue to anterior surface of pectoralis. The lateral border of pectoralis identified and elevated. Inferior insertions pectoralis divided in limited fashion to dual plane 2 dissection. Interrupted 2-0 PDS suture placed from chest wall to superficial fascia caudal to incision to stabilize inframammary fold. Sizer placed in cavity.  I then directed to left chest. Incision made in inframammary scar. Incision carried through superficial fascia and breast tissue to implant capsule. Dissection completed over surface of capsule. Gross extracapsular rupture with silicone granuloma forming over 12 o clock breast and this was excised with capsule. Implant removed and remainder capsule excised. Significantly more fibrosis breast tissue and thicker capsule noted over left breast. Cavity was thoroughly irrigated with saline and hemostasis obtained. The subglandular pocket was closed with interrupted 2-0 PDS from breast tissue to anterior surface of pectoralis. The lateral border of pectoralis identified and elevated. Inferior insertions pectoralis divided in limited fashion to dual plane 2 dissection. Interrupted 2-0 PDS suture placed from chest wall to superficial fascia caudal to incision to stabilize inframammary fold. Sizer placed in cavity.  Patient brought to upright sitting position and assessed for symmetry. Moderate Profile 270 ml implants, filled to 300 ml selected. Patient returned to supine position. Cavities irrigated with saline solution containing Ancef, gentamicin and bacitracin. Hemostasis ensured. 15 Fr JP placed in each cavity and secured with 2-0 nylon. Cavity irrigated with Betadine. Implant prepared and placed in right breast. Implant filled to 300 ml and fill tubing  removed. Tab closure and implant orientation ensured. Closure completed with 3-0 vicryl for closure superficial fascia. 4-0 vicryl used to approximate dermis and skin closure  completed with running 4-0 monocryl. Implant placed in left breast following Betadine irrigation and filled to 300 ml. Tab closure and implant orientation ensured. Closure completed in similar fashion. Tissue adhesive, dry dressing, and breast binder applied.  The patient was allowed to wake from anesthesia, extubated and taken to the recovery room in satisfactory condition.   SPECIMENS: right capsule, left capsule  DRAINS: 15 Fr JP in right and left breast  Irene Limbo, MD [facility] Plastic & Reconstructive Surgery [phone], pin [phone]

## 2017-01-18 ENCOUNTER — Encounter (HOSPITAL_BASED_OUTPATIENT_CLINIC_OR_DEPARTMENT_OTHER): Payer: Self-pay | Admitting: Plastic Surgery

## 2017-02-22 DIAGNOSIS — Z23 Encounter for immunization: Secondary | ICD-10-CM | POA: Diagnosis not present

## 2017-03-10 ENCOUNTER — Other Ambulatory Visit: Payer: Self-pay | Admitting: Hematology and Oncology

## 2017-03-16 DIAGNOSIS — C50212 Malignant neoplasm of upper-inner quadrant of left female breast: Secondary | ICD-10-CM | POA: Diagnosis not present

## 2017-04-29 DIAGNOSIS — Z0000 Encounter for general adult medical examination without abnormal findings: Secondary | ICD-10-CM | POA: Diagnosis not present

## 2017-05-04 DIAGNOSIS — Z0000 Encounter for general adult medical examination without abnormal findings: Secondary | ICD-10-CM | POA: Diagnosis not present

## 2017-05-11 DIAGNOSIS — M47812 Spondylosis without myelopathy or radiculopathy, cervical region: Secondary | ICD-10-CM | POA: Diagnosis not present

## 2017-05-11 DIAGNOSIS — M9903 Segmental and somatic dysfunction of lumbar region: Secondary | ICD-10-CM | POA: Diagnosis not present

## 2017-05-11 DIAGNOSIS — M9901 Segmental and somatic dysfunction of cervical region: Secondary | ICD-10-CM | POA: Diagnosis not present

## 2017-05-12 DIAGNOSIS — M47812 Spondylosis without myelopathy or radiculopathy, cervical region: Secondary | ICD-10-CM | POA: Diagnosis not present

## 2017-05-12 DIAGNOSIS — M9903 Segmental and somatic dysfunction of lumbar region: Secondary | ICD-10-CM | POA: Diagnosis not present

## 2017-05-12 DIAGNOSIS — M9901 Segmental and somatic dysfunction of cervical region: Secondary | ICD-10-CM | POA: Diagnosis not present

## 2017-05-13 DIAGNOSIS — M9903 Segmental and somatic dysfunction of lumbar region: Secondary | ICD-10-CM | POA: Diagnosis not present

## 2017-05-13 DIAGNOSIS — M47812 Spondylosis without myelopathy or radiculopathy, cervical region: Secondary | ICD-10-CM | POA: Diagnosis not present

## 2017-05-13 DIAGNOSIS — M9901 Segmental and somatic dysfunction of cervical region: Secondary | ICD-10-CM | POA: Diagnosis not present

## 2017-05-17 DIAGNOSIS — M9903 Segmental and somatic dysfunction of lumbar region: Secondary | ICD-10-CM | POA: Diagnosis not present

## 2017-05-17 DIAGNOSIS — M9901 Segmental and somatic dysfunction of cervical region: Secondary | ICD-10-CM | POA: Diagnosis not present

## 2017-05-17 DIAGNOSIS — M47812 Spondylosis without myelopathy or radiculopathy, cervical region: Secondary | ICD-10-CM | POA: Diagnosis not present

## 2017-05-18 DIAGNOSIS — M47812 Spondylosis without myelopathy or radiculopathy, cervical region: Secondary | ICD-10-CM | POA: Diagnosis not present

## 2017-05-18 DIAGNOSIS — M9903 Segmental and somatic dysfunction of lumbar region: Secondary | ICD-10-CM | POA: Diagnosis not present

## 2017-05-18 DIAGNOSIS — M9901 Segmental and somatic dysfunction of cervical region: Secondary | ICD-10-CM | POA: Diagnosis not present

## 2017-05-20 DIAGNOSIS — M47812 Spondylosis without myelopathy or radiculopathy, cervical region: Secondary | ICD-10-CM | POA: Diagnosis not present

## 2017-05-20 DIAGNOSIS — M9901 Segmental and somatic dysfunction of cervical region: Secondary | ICD-10-CM | POA: Diagnosis not present

## 2017-05-20 DIAGNOSIS — M9903 Segmental and somatic dysfunction of lumbar region: Secondary | ICD-10-CM | POA: Diagnosis not present

## 2017-05-31 DIAGNOSIS — M9903 Segmental and somatic dysfunction of lumbar region: Secondary | ICD-10-CM | POA: Diagnosis not present

## 2017-05-31 DIAGNOSIS — M47812 Spondylosis without myelopathy or radiculopathy, cervical region: Secondary | ICD-10-CM | POA: Diagnosis not present

## 2017-05-31 DIAGNOSIS — M9901 Segmental and somatic dysfunction of cervical region: Secondary | ICD-10-CM | POA: Diagnosis not present

## 2017-06-03 DIAGNOSIS — M9903 Segmental and somatic dysfunction of lumbar region: Secondary | ICD-10-CM | POA: Diagnosis not present

## 2017-06-03 DIAGNOSIS — M9901 Segmental and somatic dysfunction of cervical region: Secondary | ICD-10-CM | POA: Diagnosis not present

## 2017-06-03 DIAGNOSIS — M47812 Spondylosis without myelopathy or radiculopathy, cervical region: Secondary | ICD-10-CM | POA: Diagnosis not present

## 2017-06-07 DIAGNOSIS — M47812 Spondylosis without myelopathy or radiculopathy, cervical region: Secondary | ICD-10-CM | POA: Diagnosis not present

## 2017-06-07 DIAGNOSIS — M9901 Segmental and somatic dysfunction of cervical region: Secondary | ICD-10-CM | POA: Diagnosis not present

## 2017-06-07 DIAGNOSIS — M9903 Segmental and somatic dysfunction of lumbar region: Secondary | ICD-10-CM | POA: Diagnosis not present

## 2017-06-08 ENCOUNTER — Other Ambulatory Visit: Payer: Self-pay | Admitting: Hematology and Oncology

## 2017-06-10 DIAGNOSIS — M47812 Spondylosis without myelopathy or radiculopathy, cervical region: Secondary | ICD-10-CM | POA: Diagnosis not present

## 2017-06-10 DIAGNOSIS — M9901 Segmental and somatic dysfunction of cervical region: Secondary | ICD-10-CM | POA: Diagnosis not present

## 2017-06-10 DIAGNOSIS — M9903 Segmental and somatic dysfunction of lumbar region: Secondary | ICD-10-CM | POA: Diagnosis not present

## 2017-06-14 DIAGNOSIS — M9903 Segmental and somatic dysfunction of lumbar region: Secondary | ICD-10-CM | POA: Diagnosis not present

## 2017-06-14 DIAGNOSIS — M9901 Segmental and somatic dysfunction of cervical region: Secondary | ICD-10-CM | POA: Diagnosis not present

## 2017-06-14 DIAGNOSIS — M47812 Spondylosis without myelopathy or radiculopathy, cervical region: Secondary | ICD-10-CM | POA: Diagnosis not present

## 2017-08-12 DIAGNOSIS — C50212 Malignant neoplasm of upper-inner quadrant of left female breast: Secondary | ICD-10-CM | POA: Diagnosis not present

## 2017-09-30 DIAGNOSIS — D485 Neoplasm of uncertain behavior of skin: Secondary | ICD-10-CM | POA: Diagnosis not present

## 2017-09-30 DIAGNOSIS — D2261 Melanocytic nevi of right upper limb, including shoulder: Secondary | ICD-10-CM | POA: Diagnosis not present

## 2017-09-30 DIAGNOSIS — Z85828 Personal history of other malignant neoplasm of skin: Secondary | ICD-10-CM | POA: Diagnosis not present

## 2017-09-30 DIAGNOSIS — L821 Other seborrheic keratosis: Secondary | ICD-10-CM | POA: Diagnosis not present

## 2017-11-17 DIAGNOSIS — Z853 Personal history of malignant neoplasm of breast: Secondary | ICD-10-CM | POA: Diagnosis not present

## 2017-11-17 DIAGNOSIS — Z923 Personal history of irradiation: Secondary | ICD-10-CM | POA: Diagnosis not present

## 2017-11-17 DIAGNOSIS — Z9882 Breast implant status: Secondary | ICD-10-CM | POA: Diagnosis not present

## 2017-12-22 ENCOUNTER — Inpatient Hospital Stay: Payer: Self-pay | Attending: Hematology and Oncology | Admitting: Hematology and Oncology

## 2017-12-22 NOTE — Assessment & Plan Note (Deleted)
Left lumpectomy 12/04/2015: IDC with DCIS, 0.8 cm, margins negative, 0/5 lymph nodes negative, ER 90%, PR 90%, HER-2 negative, Ki-67 3%, T1 cN0 stage IA Oncotype Dx 10 (7% ROR) Adj XRT 01/07/16 to 02/20/16  Treatment Plan: Adjuvant antiestrogen therapy with anastrozole 1 mg daily 5 yearsstarted 03/10/2016  Anastrozole Toxicities:  1. Occasional muscle cramps Denies any hot flashes  Breast cancer surveillance: 1.  Breast exam 12/22/2017: Benign 2. mammogram  [place]  RTC in one year for follow up

## 2018-01-06 DIAGNOSIS — M25519 Pain in unspecified shoulder: Secondary | ICD-10-CM | POA: Diagnosis not present

## 2018-01-06 DIAGNOSIS — M1990 Unspecified osteoarthritis, unspecified site: Secondary | ICD-10-CM | POA: Diagnosis not present

## 2018-01-06 DIAGNOSIS — M19011 Primary osteoarthritis, right shoulder: Secondary | ICD-10-CM | POA: Diagnosis not present

## 2018-01-06 DIAGNOSIS — M25511 Pain in right shoulder: Secondary | ICD-10-CM | POA: Diagnosis not present

## 2018-01-11 ENCOUNTER — Other Ambulatory Visit: Payer: Self-pay | Admitting: Internal Medicine

## 2018-01-11 DIAGNOSIS — R911 Solitary pulmonary nodule: Secondary | ICD-10-CM

## 2018-01-14 ENCOUNTER — Ambulatory Visit
Admission: RE | Admit: 2018-01-14 | Discharge: 2018-01-14 | Disposition: A | Discharge status: Home / Self Care | Payer: 59 | Source: Ambulatory Visit | Attending: Internal Medicine | Admitting: Internal Medicine

## 2018-01-14 DIAGNOSIS — R911 Solitary pulmonary nodule: Secondary | ICD-10-CM | POA: Diagnosis not present

## 2018-01-19 DIAGNOSIS — M79642 Pain in left hand: Secondary | ICD-10-CM | POA: Diagnosis not present

## 2018-01-19 DIAGNOSIS — M25559 Pain in unspecified hip: Secondary | ICD-10-CM | POA: Diagnosis not present

## 2018-01-19 DIAGNOSIS — M160 Bilateral primary osteoarthritis of hip: Secondary | ICD-10-CM | POA: Diagnosis not present

## 2018-01-19 DIAGNOSIS — M2550 Pain in unspecified joint: Secondary | ICD-10-CM | POA: Diagnosis not present

## 2018-01-19 DIAGNOSIS — M791 Myalgia, unspecified site: Secondary | ICD-10-CM | POA: Diagnosis not present

## 2018-01-19 DIAGNOSIS — M25562 Pain in left knee: Secondary | ICD-10-CM | POA: Diagnosis not present

## 2018-01-19 DIAGNOSIS — M064 Inflammatory polyarthropathy: Secondary | ICD-10-CM | POA: Diagnosis not present

## 2018-01-19 DIAGNOSIS — M170 Bilateral primary osteoarthritis of knee: Secondary | ICD-10-CM | POA: Diagnosis not present

## 2018-02-09 DIAGNOSIS — Z6828 Body mass index (BMI) 28.0-28.9, adult: Secondary | ICD-10-CM | POA: Diagnosis not present

## 2018-02-09 DIAGNOSIS — Z23 Encounter for immunization: Secondary | ICD-10-CM | POA: Diagnosis not present

## 2018-02-09 DIAGNOSIS — Z01419 Encounter for gynecological examination (general) (routine) without abnormal findings: Secondary | ICD-10-CM | POA: Diagnosis not present

## 2018-02-10 DIAGNOSIS — M2550 Pain in unspecified joint: Secondary | ICD-10-CM | POA: Diagnosis not present

## 2018-02-10 DIAGNOSIS — M1990 Unspecified osteoarthritis, unspecified site: Secondary | ICD-10-CM | POA: Diagnosis not present

## 2018-02-10 DIAGNOSIS — M791 Myalgia, unspecified site: Secondary | ICD-10-CM | POA: Diagnosis not present

## 2018-05-03 DIAGNOSIS — E559 Vitamin D deficiency, unspecified: Secondary | ICD-10-CM | POA: Diagnosis not present

## 2018-05-03 DIAGNOSIS — E049 Nontoxic goiter, unspecified: Secondary | ICD-10-CM | POA: Diagnosis not present

## 2018-05-03 DIAGNOSIS — Z0000 Encounter for general adult medical examination without abnormal findings: Secondary | ICD-10-CM | POA: Diagnosis not present

## 2018-05-06 DIAGNOSIS — Z23 Encounter for immunization: Secondary | ICD-10-CM | POA: Diagnosis not present

## 2018-05-06 DIAGNOSIS — Z0000 Encounter for general adult medical examination without abnormal findings: Secondary | ICD-10-CM | POA: Diagnosis not present

## 2018-05-20 ENCOUNTER — Other Ambulatory Visit: Payer: Self-pay | Admitting: Hematology and Oncology

## 2018-05-20 ENCOUNTER — Telehealth: Payer: Self-pay

## 2018-05-20 MED ORDER — ANASTROZOLE 1 MG PO TABS: 1.0000 mg | ORAL_TABLET | Freq: Every day | ORAL | 0 refills | Status: DC

## 2018-05-20 NOTE — Telephone Encounter (Signed)
Dr.Gudena please advise.  Seen last in 2018.  No pending appointments.

## 2018-05-20 NOTE — Telephone Encounter (Signed)
30-day supply of Anastrozole sent to pharmacy.  Left message for patient to CB to schedule follow up appointment for future refills.

## 2018-05-31 DIAGNOSIS — M1611 Unilateral primary osteoarthritis, right hip: Secondary | ICD-10-CM | POA: Diagnosis not present

## 2018-05-31 DIAGNOSIS — M1612 Unilateral primary osteoarthritis, left hip: Secondary | ICD-10-CM | POA: Diagnosis not present

## 2018-06-02 ENCOUNTER — Telehealth: Payer: Self-pay | Admitting: Hematology and Oncology

## 2018-06-02 NOTE — Telephone Encounter (Signed)
Patient called to schedule  °

## 2018-06-21 ENCOUNTER — Inpatient Hospital Stay: Payer: 59 | Admitting: Hematology and Oncology

## 2018-06-21 NOTE — Assessment & Plan Note (Signed)
Left lumpectomy 12/04/2015: IDC with DCIS, 0.8 cm, margins negative, 0/5 lymph nodes negative, ER 90%, PR 90%, HER-2 negative, Ki-67 3%, T1 cN0 stage IA Oncotype Dx 10 (7% ROR) Adj XRT 01/07/16 to 02/20/16  Treatment Plan: Adjuvant antiestrogen therapy with anastrozole 1 mg daily 5 yearsstarted 03/10/2016 01/15/2017: Removal of bilateral silicone implants due to capsular rupture, bilateral capsulectomies, saline augmentation bilaterally  Anastrozole Toxicities:  Occasional muscle cramps Denies any hot flashes  Breast cancer surveillance: 1.  Breast exam 06/21/2018: Benign 2.  Mammogram  [place]. RTC in one year for follow up

## 2018-07-03 NOTE — Assessment & Plan Note (Signed)
Left lumpectomy 12/04/2015: IDC with DCIS, 0.8 cm, margins negative, 0/5 lymph nodes negative, ER 90%, PR 90%, HER-2 negative, Ki-67 3%, T1 cN0 stage IA Oncotype Dx 10 (7% ROR) Adj XRT 01/07/16 to 02/20/16  Treatment Plan: Adjuvant antiestrogen therapy with anastrozole 1 mg daily 5 yearsstarted 03/10/2016 01/15/2017: Removal of bilateral silicone implants due to capsular rupture, bilateral capsulectomies, saline augmentation bilaterally  Anastrozole Toxicities:  Occasional muscle cramps Denies any hot flashes  Breast cancer surveillance: 1.  Breast exam 07/04/2018: Benign 2.  Mammogram  [place]. RTC in one year for follow up

## 2018-07-04 ENCOUNTER — Inpatient Hospital Stay: Payer: 59 | Attending: Hematology and Oncology | Admitting: Hematology and Oncology

## 2018-07-04 ENCOUNTER — Telehealth: Payer: Self-pay | Admitting: Hematology and Oncology

## 2018-07-04 ENCOUNTER — Other Ambulatory Visit: Payer: Self-pay

## 2018-07-04 DIAGNOSIS — Z170 Estrogen receptor positive status [ER+]: Secondary | ICD-10-CM | POA: Diagnosis not present

## 2018-07-04 DIAGNOSIS — C50212 Malignant neoplasm of upper-inner quadrant of left female breast: Secondary | ICD-10-CM | POA: Diagnosis not present

## 2018-07-04 DIAGNOSIS — Z79811 Long term (current) use of aromatase inhibitors: Secondary | ICD-10-CM | POA: Diagnosis not present

## 2018-07-04 DIAGNOSIS — Z923 Personal history of irradiation: Secondary | ICD-10-CM | POA: Diagnosis not present

## 2018-07-04 MED ORDER — TURMERIC 500 MG PO CAPS: 1.0000 | ORAL_CAPSULE | Freq: Every day | ORAL | Status: AC

## 2018-07-04 NOTE — Progress Notes (Signed)
Patient Care Team: Deland Pretty, MD as PCP - General (Internal Medicine) Stark Klein, MD as Consulting Physician (General Surgery) Nicholas Lose, MD as Consulting Physician (Hematology and Oncology) Delice Bison, Charlestine Massed, NP as Nurse Practitioner (Hematology and Oncology) Gery Pray, MD as Consulting Physician (Radiation Oncology)  DIAGNOSIS:  Encounter Diagnosis  Name Primary?  . Malignant neoplasm of upper-inner quadrant of left breast in female, estrogen receptor positive ([place])     SUMMARY OF ONCOLOGIC HISTORY:   Breast cancer of upper-inner quadrant of left female breast ([place])   11/13/2015 Initial Diagnosis    Left breast biopsy: Invasive ductal carcinoma with ADH, grade 1, ER 90%, PR 90%, Ki-67 3%, HER-2 negative ratio 1.35 copy #2; left breast spiculated mass 8 mm at 9:00 position, axilla negative, T1 BN 0 stage IA clinical stage    12/04/2015 Surgery    Left lumpectomy [facility]): IDC with DCIS, 0.8 cm, margins negative, 0/5 lymph nodes negative, ER 90%, PR 90%, HER-2 negative, Ki-67 3%, T1 cN0 stage IA    12/04/2015 Oncotype testing    10/7%, low risk    01/07/2016 - 02/20/2016 Radiation Therapy    Adjuvant XRT ([place]): 1. The Left breast was treated to 50.4 Gy in 28 fractions at 1.8 Gy per fraction. 2. The Left breast was boosted to 10 Gy in 5 fractions at 2 Gy per fraction.     03/10/2016 -  Anti-estrogen oral therapy    Anastrozole 1 mg by mouth daily     CHIEF COMPLIANT: Follow-up on anastrozole therapy  INTERVAL HISTORY: Christina Rivera is a 63 year old above-mentioned staff breast cancer treated with lumpectomy radiation is currently on oral antiestrogen therapy with anastrozole.  She is tolerating it extremely well.  She denies any significant hot flashes.  Muscle aches have improved.  This was because of turmeric.  She is seeing orthopedics for osteoarthritis of her knees and the ankles.  REVIEW OF SYSTEMS:   Constitutional: Denies fevers, chills or  abnormal weight loss Eyes: Denies blurriness of vision Ears, nose, mouth, throat, and face: Denies mucositis or sore throat Respiratory: Denies cough, dyspnea or wheezes Cardiovascular: Denies palpitation, chest discomfort Gastrointestinal:  Denies nausea, heartburn or change in bowel habits Skin: Denies abnormal skin rashes Lymphatics: Denies new lymphadenopathy or easy bruising Neurological:Denies numbness, tingling or new weaknesses Behavioral/Psych: Mood is stable, no new changes  Extremities: No lower extremity edema Breast:  denies any pain or lumps or nodules in either breasts All other systems were reviewed with the patient and are negative.  I have reviewed the past medical history, past surgical history, social history and family history with the patient and they are unchanged from previous note.  ALLERGIES:  is allergic to tramadol; doxycycline; neomycin; and tape.  MEDICATIONS:  Current Outpatient Medications  Medication Sig Dispense Refill  . anastrozole (ARIMIDEX) 1 MG tablet TAKE 1 TABLET(1 MG) BY MOUTH DAILY 90 tablet 3  . anastrozole (ARIMIDEX) 1 MG tablet Take 1 tablet (1 mg total) by mouth daily. 30 tablet 0  . calcium carbonate (OSCAL) 1500 (600 Ca) MG TABS tablet Take by mouth 2 (two) times daily with a meal.    . fexofenadine (ALLEGRA) 180 MG tablet Take 180 mg by mouth daily.    Marland Kitchen FINACEA 15 % cream Apply 1 application topically 2 (two) times daily.     . Triamcinolone Acetonide (NASACORT ALLERGY 24HR NA) Place into the nose daily.     No current facility-administered medications for this visit.     PHYSICAL  EXAMINATION: ECOG PERFORMANCE STATUS: 1 - Symptomatic but completely ambulatory  Vitals:   07/04/18 0925  BP: 129/69  Pulse: 69  Resp: 17  Temp: 98.6 F (37 C)  SpO2: 100%   Filed Weights   07/04/18 0925  Weight: 184 lb 6.4 oz (83.6 kg)    GENERAL:alert, no distress and comfortable SKIN: skin color, texture, turgor are normal, no rashes or  significant lesions EYES: normal, Conjunctiva are pink and non-injected, sclera clear OROPHARYNX:no exudate, no erythema and lips, buccal mucosa, and tongue normal  NECK: supple, thyroid normal size, non-tender, without nodularity LYMPH:  no palpable lymphadenopathy in the cervical, axillary or inguinal LUNGS: clear to auscultation and percussion with normal breathing effort HEART: regular rate & rhythm and no murmurs and no lower extremity edema ABDOMEN:abdomen soft, non-tender and normal bowel sounds MUSCULOSKELETAL:no cyanosis of digits and no clubbing  NEURO: alert & oriented x 3 with fluent speech, no focal motor/sensory deficits EXTREMITIES: No lower extremity edema BREAST: No palpable masses or nodules in either right or left breasts. No palpable axillary supraclavicular or infraclavicular adenopathy no breast tenderness or nipple discharge. (exam performed in the presence of a chaperone)  LABORATORY DATA:  I have reviewed the data as listed CMP Latest Ref Rng & Units 11/20/2015 09/29/2006  Glucose 70 - 140 mg/dl 127 88  BUN 7.0 - 26.0 mg/dL 17.3 9  Creatinine 0.6 - 1.1 mg/dL 0.8 0.67  Sodium 136 - 145 mEq/L 141 134(L)  Potassium 3.5 - 5.1 mEq/L 3.8 4.5  Chloride - - 102  CO2 22 - 29 mEq/L 25 25  Calcium 8.4 - 10.4 mg/dL 9.5 9.4  Total Protein 6.4 - 8.3 g/dL 7.7 -  Total Bilirubin 0.20 - 1.20 mg/dL 0.37 -  Alkaline Phos 40 - 150 U/L 55 -  AST 5 - 34 U/L 18 -  ALT 0 - 55 U/L 24 -    Lab Results  Component Value Date   WBC 9.1 11/20/2015   HGB 14.2 11/20/2015   HCT 43.5 11/20/2015   MCV 88.0 11/20/2015   PLT 291 11/20/2015   NEUTROABS 5.8 11/20/2015    ASSESSMENT & PLAN:  Breast cancer of upper-inner quadrant of left female breast ([place]) Left lumpectomy 12/04/2015: IDC with DCIS, 0.8 cm, margins negative, 0/5 lymph nodes negative, ER 90%, PR 90%, HER-2 negative, Ki-67 3%, T1 cN0 stage IA Oncotype Dx 10 (7% ROR) Adj XRT 01/07/16 to 02/20/16  Treatment Plan: Adjuvant  antiestrogen therapy with anastrozole 1 mg daily 5 yearsstarted 03/10/2016 01/15/2017: Removal of bilateral silicone implants due to capsular rupture, bilateral capsulectomies, saline augmentation bilaterally  Anastrozole Toxicities:  Occasional muscle cramps: Turmeric has helped this. Denies any hot flashes  Osteoarthritis: Patient has seen orthopedic specialist.  Breast cancer surveillance: 1.  Breast exam 07/04/2018: Benign 2.  Mammogram: July 2019 at [facility]  She has purchased a property on [address] and is remodeling it completely.  Her daughters are going to be married this year.  She is very excited about that.  RTC in one year for follow up    No orders of the defined types were placed in this encounter.  The patient has a good understanding of the overall plan. she agrees with it. she will call with any problems that may develop before the next visit here.   Harriette Ohara, MD 07/04/18

## 2018-07-04 NOTE — Telephone Encounter (Signed)
Gave avs and calendar ° °

## 2018-07-06 DIAGNOSIS — M67962 Unspecified disorder of synovium and tendon, left lower leg: Secondary | ICD-10-CM | POA: Diagnosis not present

## 2018-07-06 DIAGNOSIS — M79672 Pain in left foot: Secondary | ICD-10-CM | POA: Diagnosis not present

## 2018-07-06 DIAGNOSIS — Q742 Other congenital malformations of lower limb(s), including pelvic girdle: Secondary | ICD-10-CM | POA: Diagnosis not present

## 2018-07-18 DIAGNOSIS — Z23 Encounter for immunization: Secondary | ICD-10-CM | POA: Diagnosis not present

## 2018-07-19 DIAGNOSIS — M25552 Pain in left hip: Secondary | ICD-10-CM | POA: Diagnosis not present

## 2018-07-19 DIAGNOSIS — M25551 Pain in right hip: Secondary | ICD-10-CM | POA: Diagnosis not present

## 2018-08-18 DIAGNOSIS — N8110 Cystocele, unspecified: Secondary | ICD-10-CM | POA: Diagnosis not present

## 2018-09-07 DIAGNOSIS — N8110 Cystocele, unspecified: Secondary | ICD-10-CM | POA: Diagnosis not present

## 2018-12-08 ENCOUNTER — Encounter: Payer: Self-pay | Admitting: Hematology and Oncology

## 2019-02-05 IMAGING — DX DG RIBS W/ CHEST 3+V*L*
3 series · 3 of 3 positions shown · non-contrast
Comparison: No prior for comparison

CLINICAL DATA: 60-year-old female with a history of anterior chest
pain/eighth rib tenderness

EXAM:
LEFT RIBS AND CHEST - 3+ VIEW

[chest pa]
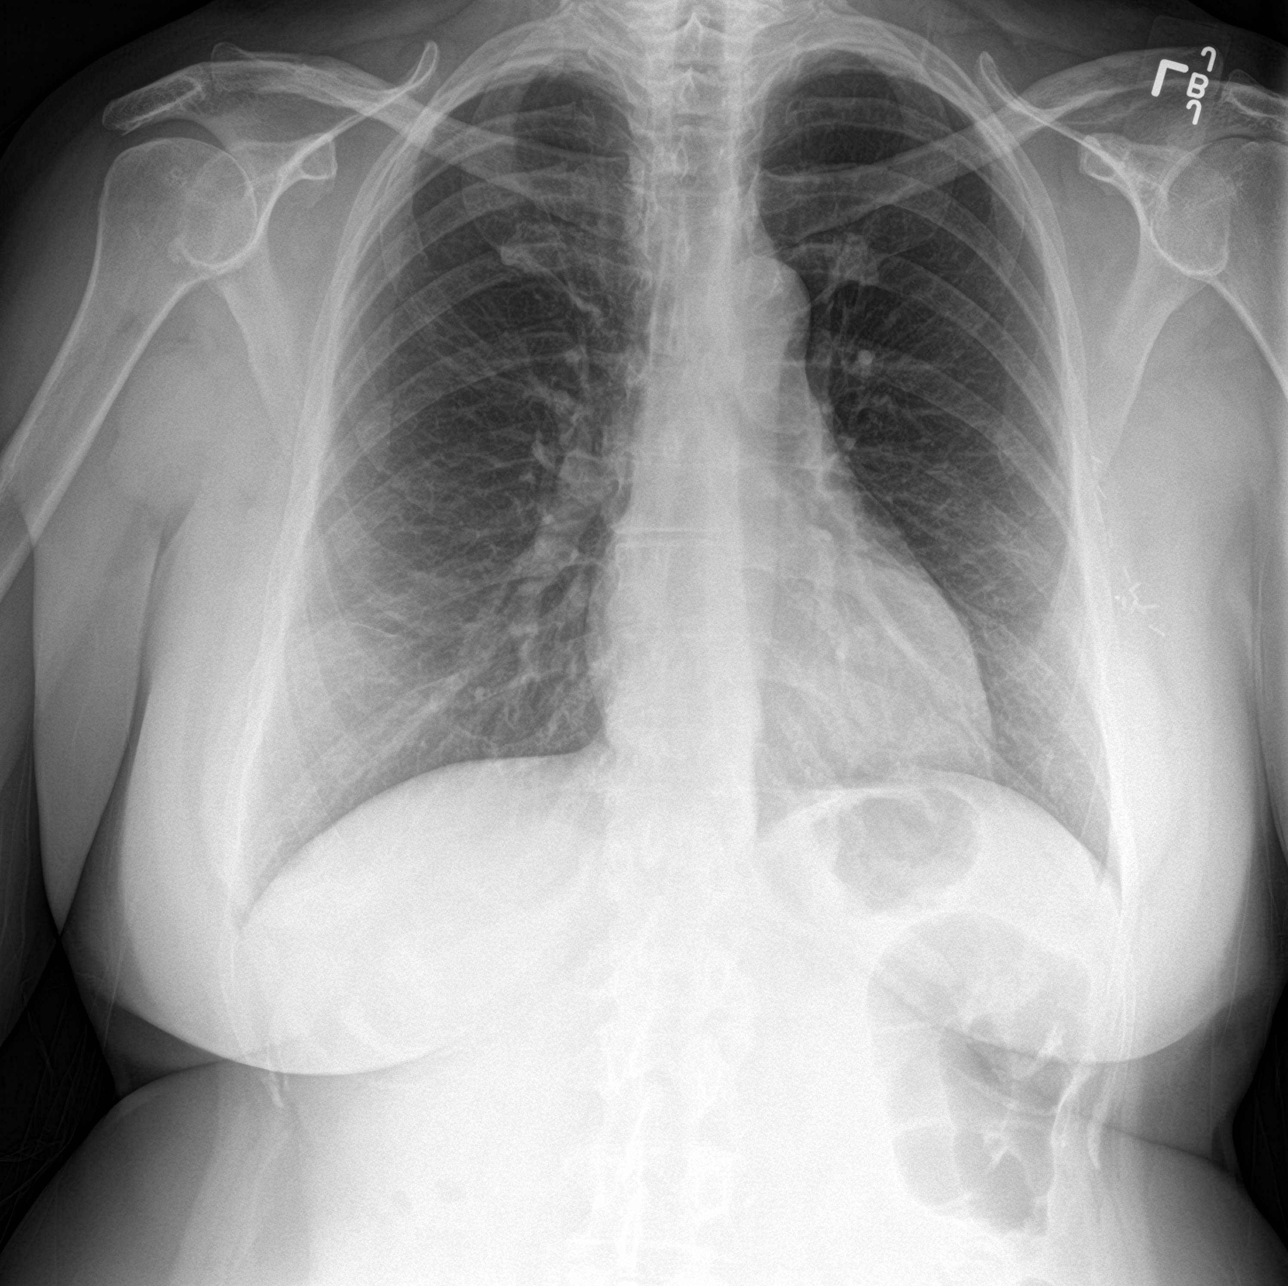

[rib pa obl (1 of 2)]
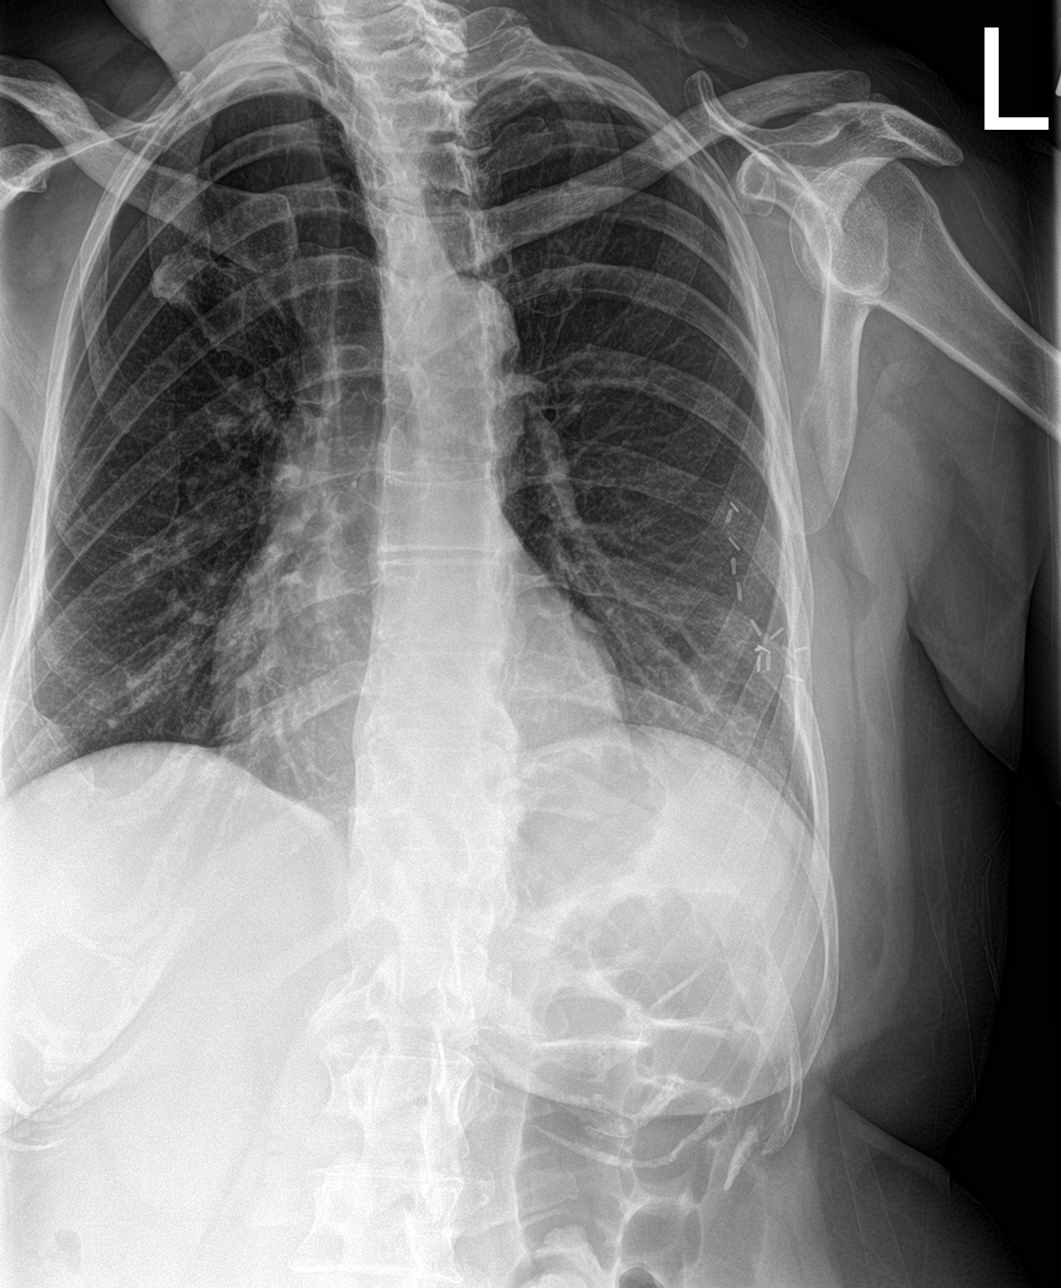

[rib pa obl (2 of 2)]
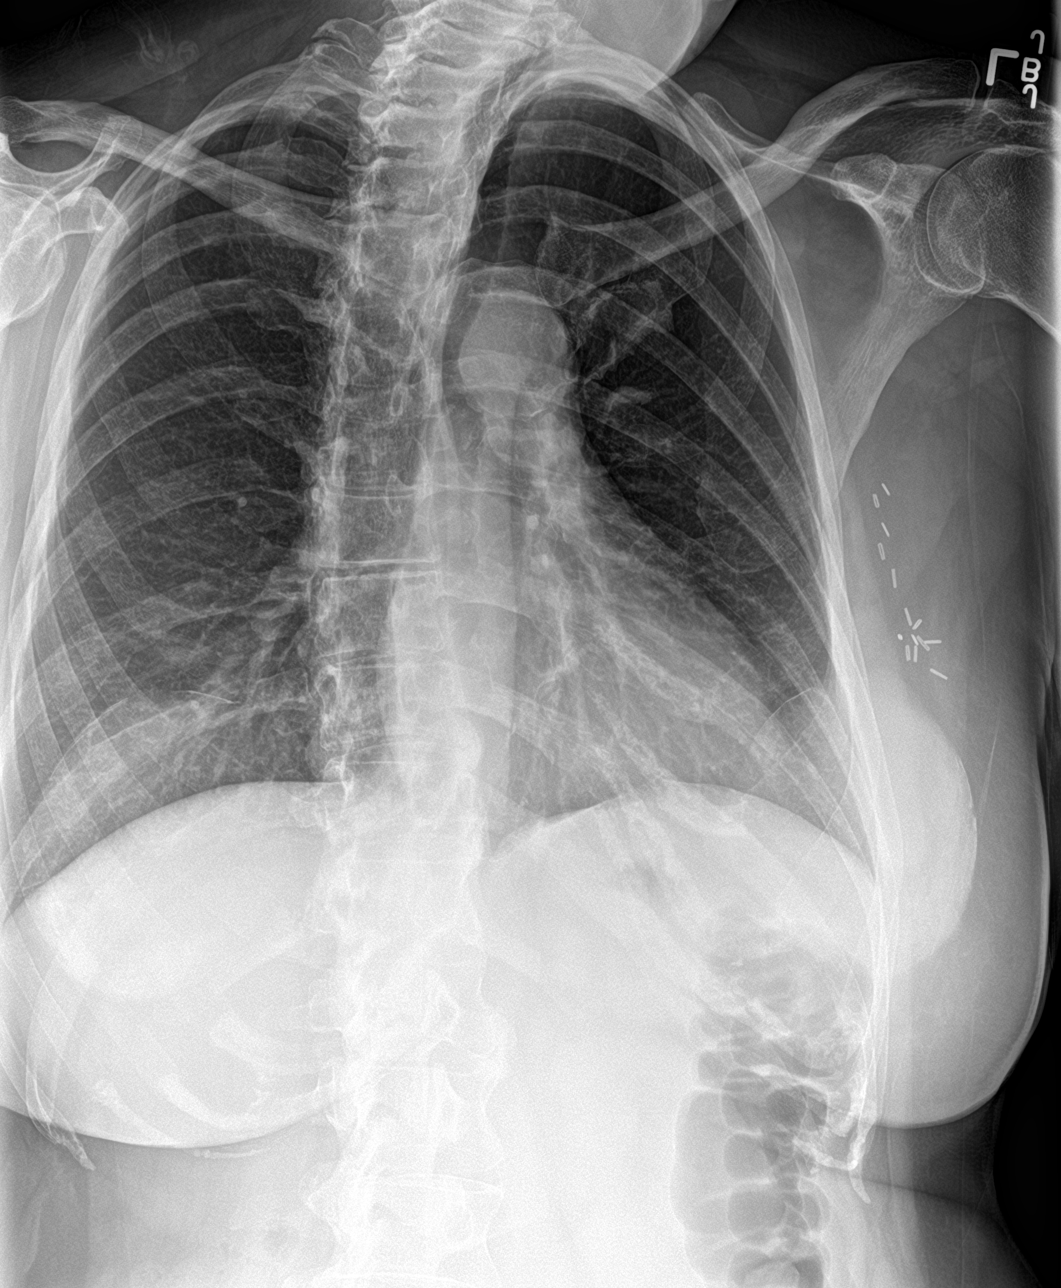

[3 of 3 positions shown; findings below may reference images not displayed]

FINDINGS: Cardiomediastinal silhouette within normal limits.

No central vascular congestion.  No interlobular septal thickening.

No confluent airspace disease, pneumothorax, or pleural effusion.

Surgical changes of the left chest wall with bilateral breast
implant.

No displaced fracture identified.
IMPRESSION: Negative for acute cardiopulmonary disease.

No displaced rib fracture identified.

Surgical changes of the left chest wall and bilateral breast
implants

## 2019-04-27 DIAGNOSIS — N8110 Cystocele, unspecified: Secondary | ICD-10-CM | POA: Diagnosis not present

## 2019-05-01 DIAGNOSIS — M79645 Pain in left finger(s): Secondary | ICD-10-CM | POA: Diagnosis not present

## 2019-05-01 DIAGNOSIS — M25642 Stiffness of left hand, not elsewhere classified: Secondary | ICD-10-CM | POA: Diagnosis not present

## 2019-05-01 DIAGNOSIS — S62617D Displaced fracture of proximal phalanx of left little finger, subsequent encounter for fracture with routine healing: Secondary | ICD-10-CM | POA: Diagnosis not present

## 2019-05-02 ENCOUNTER — Other Ambulatory Visit: Payer: Self-pay | Admitting: Hematology and Oncology

## 2019-05-03 DIAGNOSIS — S62617D Displaced fracture of proximal phalanx of left little finger, subsequent encounter for fracture with routine healing: Secondary | ICD-10-CM | POA: Diagnosis not present

## 2019-05-03 DIAGNOSIS — M79645 Pain in left finger(s): Secondary | ICD-10-CM | POA: Diagnosis not present

## 2019-05-03 DIAGNOSIS — M25642 Stiffness of left hand, not elsewhere classified: Secondary | ICD-10-CM | POA: Diagnosis not present

## 2019-05-08 DIAGNOSIS — M79645 Pain in left finger(s): Secondary | ICD-10-CM | POA: Diagnosis not present

## 2019-05-08 DIAGNOSIS — S62617D Displaced fracture of proximal phalanx of left little finger, subsequent encounter for fracture with routine healing: Secondary | ICD-10-CM | POA: Diagnosis not present

## 2019-05-08 DIAGNOSIS — M25642 Stiffness of left hand, not elsewhere classified: Secondary | ICD-10-CM | POA: Diagnosis not present

## 2019-05-12 DIAGNOSIS — M79645 Pain in left finger(s): Secondary | ICD-10-CM | POA: Diagnosis not present

## 2019-05-12 DIAGNOSIS — M25642 Stiffness of left hand, not elsewhere classified: Secondary | ICD-10-CM | POA: Diagnosis not present

## 2019-05-12 DIAGNOSIS — S62617D Displaced fracture of proximal phalanx of left little finger, subsequent encounter for fracture with routine healing: Secondary | ICD-10-CM | POA: Diagnosis not present

## 2019-05-15 DIAGNOSIS — Z13220 Encounter for screening for lipoid disorders: Secondary | ICD-10-CM | POA: Diagnosis not present

## 2019-05-15 DIAGNOSIS — Z0000 Encounter for general adult medical examination without abnormal findings: Secondary | ICD-10-CM | POA: Diagnosis not present

## 2019-05-19 DIAGNOSIS — Z0000 Encounter for general adult medical examination without abnormal findings: Secondary | ICD-10-CM | POA: Diagnosis not present

## 2019-05-22 DIAGNOSIS — M25642 Stiffness of left hand, not elsewhere classified: Secondary | ICD-10-CM | POA: Diagnosis not present

## 2019-05-22 DIAGNOSIS — S62617D Displaced fracture of proximal phalanx of left little finger, subsequent encounter for fracture with routine healing: Secondary | ICD-10-CM | POA: Diagnosis not present

## 2019-05-22 DIAGNOSIS — M79645 Pain in left finger(s): Secondary | ICD-10-CM | POA: Diagnosis not present

## 2019-05-25 DIAGNOSIS — M79645 Pain in left finger(s): Secondary | ICD-10-CM | POA: Diagnosis not present

## 2019-05-25 DIAGNOSIS — M25642 Stiffness of left hand, not elsewhere classified: Secondary | ICD-10-CM | POA: Diagnosis not present

## 2019-05-25 DIAGNOSIS — S62617D Displaced fracture of proximal phalanx of left little finger, subsequent encounter for fracture with routine healing: Secondary | ICD-10-CM | POA: Diagnosis not present

## 2019-06-07 DIAGNOSIS — M79645 Pain in left finger(s): Secondary | ICD-10-CM | POA: Diagnosis not present

## 2019-06-07 DIAGNOSIS — M25642 Stiffness of left hand, not elsewhere classified: Secondary | ICD-10-CM | POA: Diagnosis not present

## 2019-06-07 DIAGNOSIS — S62617D Displaced fracture of proximal phalanx of left little finger, subsequent encounter for fracture with routine healing: Secondary | ICD-10-CM | POA: Diagnosis not present

## 2019-07-03 NOTE — Progress Notes (Signed)
Patient Care Team: Deland Pretty, MD as PCP - General (Internal Medicine) Stark Klein, MD as Consulting Physician (General Surgery) Nicholas Lose, MD as Consulting Physician (Hematology and Oncology) Delice Bison, Charlestine Massed, NP as Nurse Practitioner (Hematology and Oncology) Gery Pray, MD as Consulting Physician (Radiation Oncology)  DIAGNOSIS:    ICD-10-CM   1. Malignant neoplasm of upper-inner quadrant of left breast in female, estrogen receptor positive ([place])  C50.212    Z17.0     SUMMARY OF ONCOLOGIC HISTORY: Oncology History  Breast cancer of upper-inner quadrant of left female breast ([place])  11/13/2015 Initial Diagnosis   Left breast biopsy: Invasive ductal carcinoma with ADH, grade 1, ER 90%, PR 90%, Ki-67 3%, HER-2 negative ratio 1.35 copy #2; left breast spiculated mass 8 mm at 9:00 position, axilla negative, T1 BN 0 stage IA clinical stage   12/04/2015 Surgery   Left lumpectomy [facility]): IDC with DCIS, 0.8 cm, margins negative, 0/5 lymph nodes negative, ER 90%, PR 90%, HER-2 negative, Ki-67 3%, T1 cN0 stage IA   12/04/2015 Oncotype testing   10/7%, low risk   01/07/2016 - 02/20/2016 Radiation Therapy   Adjuvant XRT ([place]): 1. The Left breast was treated to 50.4 Gy in 28 fractions at 1.8 Gy per fraction. 2. The Left breast was boosted to 10 Gy in 5 fractions at 2 Gy per fraction.    03/10/2016 -  Anti-estrogen oral therapy   Anastrozole 1 mg by mouth daily     CHIEF COMPLIANT: Follow-up of left breast cancer on anastrozole therapy  INTERVAL HISTORY: Christina Rivera is a 64 y.o. with above-mentioned history of left breast cancer treated with lumpectomy, radiation, and who is currently on antiestrogen therapy with anastrozole. She presents to the clinic today for annual follow-up.  Patient has been experiencing osteoarthritis symptoms.  She does have mild hot flashes that are manageable.  The muscle stiffness is improved with turmeric.  Denies any lumps or nodules  in the breast.  ALLERGIES:  is allergic to tramadol; doxycycline; neomycin; and tape.  MEDICATIONS:  Current Outpatient Medications  Medication Sig Dispense Refill  . anastrozole (ARIMIDEX) 1 MG tablet TAKE 1 TABLET(1 MG) BY MOUTH DAILY 90 tablet 3  . calcium carbonate (OSCAL) 1500 (600 Ca) MG TABS tablet Take by mouth 2 (two) times daily with a meal.    . fexofenadine (ALLEGRA) 180 MG tablet Take 180 mg by mouth daily.    Marland Kitchen FINACEA 15 % cream Apply 1 application topically 2 (two) times daily.     . Triamcinolone Acetonide (NASACORT ALLERGY 24HR NA) Place into the nose daily.    . Turmeric 500 MG CAPS Take 1 capsule by mouth daily.     No current facility-administered medications for this visit.    PHYSICAL EXAMINATION: ECOG PERFORMANCE STATUS: 1 - Symptomatic but completely ambulatory  Vitals:   07/04/19 0913  BP: 127/61  Pulse: 76  Resp: 16  Temp: 98.9 F (37.2 C)  SpO2: 100%   Filed Weights   07/04/19 0913  Weight: 188 lb 11.2 oz (85.6 kg)    BREAST: No palpable masses or nodules in either right or left breasts. No palpable axillary supraclavicular or infraclavicular adenopathy no breast tenderness or nipple discharge. (exam performed in the presence of a chaperone)  LABORATORY DATA:  I have reviewed the data as listed CMP Latest Ref Rng & Units 11/20/2015 09/29/2006  Glucose 70 - 140 mg/dl 127 88  BUN 7.0 - 26.0 mg/dL 17.3 9  Creatinine 0.6 - 1.1 mg/dL  0.8 0.67  Sodium 136 - 145 mEq/L 141 134(L)  Potassium 3.5 - 5.1 mEq/L 3.8 4.5  Chloride - - 102  CO2 22 - 29 mEq/L 25 25  Calcium 8.4 - 10.4 mg/dL 9.5 9.4  Total Protein 6.4 - 8.3 g/dL 7.7 -  Total Bilirubin 0.20 - 1.20 mg/dL 0.37 -  Alkaline Phos 40 - 150 U/L 55 -  AST 5 - 34 U/L 18 -  ALT 0 - 55 U/L 24 -    Lab Results  Component Value Date   WBC 9.1 11/20/2015   HGB 14.2 11/20/2015   HCT 43.5 11/20/2015   MCV 88.0 11/20/2015   PLT 291 11/20/2015   NEUTROABS 5.8 11/20/2015    ASSESSMENT & PLAN:    Breast cancer of upper-inner quadrant of left female breast ([place]) Left lumpectomy 12/04/2015: IDC with DCIS, 0.8 cm, margins negative, 0/5 lymph nodes negative, ER 90%, PR 90%, HER-2 negative, Ki-67 3%, T1 cN0 stage IA Oncotype Dx 10 (7% ROR) Adj XRT 01/07/16 to 02/20/16  Treatment Plan: Adjuvant antiestrogen therapy with anastrozole 1 mg daily 5 yearsstarted 03/10/2016 01/15/2017: Removal of bilateral silicone implants due to capsular rupture, bilateral capsulectomies, saline augmentation bilaterally  Anastrozole Toxicities:  Occasional muscle cramps: Turmeric has helped this. Denies any hot flashes  Osteoarthritis: Patient has seen orthopedic specialist.  She thinks she might need hip replacement surgery.  Breast cancer surveillance: 1.  Breast exam 07/04/2118: Benign 2.  Mammogram: July 2019 at [facility]  She has a property in [place] and is looking to transition to [place] for the future in the next 1 to 2 years. We discussed with her about duration of antiestrogen therapy.  I discussed the data for 7 years of treatment versus 5 years. We will make a decision when it comes closer to that time.  RTC inone yearfor follow up    No orders of the defined types were placed in this encounter.  The patient has a good understanding of the overall plan. she agrees with it. she will call with any problems that may develop before the next visit here.  Total time spent: 20 mins including face to face time and time spent for planning, charting and coordination of care  Nicholas Lose, MD 07/04/2019  I, Cloyde Reams Dorshimer, am acting as scribe for Dr. Nicholas Lose.  I have reviewed the above documentation for accuracy and completeness, and I agree with the above.

## 2019-07-04 ENCOUNTER — Inpatient Hospital Stay: Payer: BC Managed Care – PPO | Attending: Hematology and Oncology | Admitting: Hematology and Oncology

## 2019-07-04 ENCOUNTER — Other Ambulatory Visit: Payer: Self-pay

## 2019-07-04 DIAGNOSIS — Z79811 Long term (current) use of aromatase inhibitors: Secondary | ICD-10-CM | POA: Insufficient documentation

## 2019-07-04 DIAGNOSIS — C50212 Malignant neoplasm of upper-inner quadrant of left female breast: Secondary | ICD-10-CM | POA: Diagnosis not present

## 2019-07-04 DIAGNOSIS — Z170 Estrogen receptor positive status [ER+]: Secondary | ICD-10-CM | POA: Insufficient documentation

## 2019-07-04 DIAGNOSIS — Z923 Personal history of irradiation: Secondary | ICD-10-CM | POA: Diagnosis not present

## 2019-07-04 MED ORDER — ANASTROZOLE 1 MG PO TABS: ORAL_TABLET | ORAL | 3 refills | Status: DC

## 2019-07-04 NOTE — Assessment & Plan Note (Signed)
Left lumpectomy 12/04/2015: IDC with DCIS, 0.8 cm, margins negative, 0/5 lymph nodes negative, ER 90%, PR 90%, HER-2 negative, Ki-67 3%, T1 cN0 stage IA Oncotype Dx 10 (7% ROR) Adj XRT 01/07/16 to 02/20/16  Treatment Plan: Adjuvant antiestrogen therapy with anastrozole 1 mg daily 5 yearsstarted 03/10/2016 01/15/2017: Removal of bilateral silicone implants due to capsular rupture, bilateral capsulectomies, saline augmentation bilaterally  Anastrozole Toxicities:  Occasional muscle cramps: Turmeric has helped this. Denies any hot flashes  Osteoarthritis: Patient has seen orthopedic specialist.  Breast cancer surveillance: 1.  Breast exam 07/04/2118: Benign 2.  Mammogram: July 2019 at [facility]  [place].  Her daughters are going to be married this year.  She is very excited about that.  RTC inone yearfor follow up

## 2019-07-05 DIAGNOSIS — L720 Epidermal cyst: Secondary | ICD-10-CM | POA: Diagnosis not present

## 2019-07-07 ENCOUNTER — Telehealth: Payer: Self-pay | Admitting: Hematology and Oncology

## 2019-07-07 NOTE — Telephone Encounter (Signed)
Opened by accident

## 2019-07-07 NOTE — Telephone Encounter (Signed)
Scheduled per los, patient has been called and notified of upcoming appointment. 

## 2019-07-24 DIAGNOSIS — N898 Other specified noninflammatory disorders of vagina: Secondary | ICD-10-CM | POA: Diagnosis not present

## 2019-07-24 DIAGNOSIS — Z01419 Encounter for gynecological examination (general) (routine) without abnormal findings: Secondary | ICD-10-CM | POA: Diagnosis not present

## 2019-07-24 DIAGNOSIS — Z6827 Body mass index (BMI) 27.0-27.9, adult: Secondary | ICD-10-CM | POA: Diagnosis not present

## 2019-07-24 DIAGNOSIS — Z1151 Encounter for screening for human papillomavirus (HPV): Secondary | ICD-10-CM | POA: Diagnosis not present

## 2019-09-07 DIAGNOSIS — M24822 Other specific joint derangements of left elbow, not elsewhere classified: Secondary | ICD-10-CM | POA: Diagnosis not present

## 2019-09-13 DIAGNOSIS — M24822 Other specific joint derangements of left elbow, not elsewhere classified: Secondary | ICD-10-CM | POA: Diagnosis not present

## 2019-09-20 DIAGNOSIS — M1612 Unilateral primary osteoarthritis, left hip: Secondary | ICD-10-CM | POA: Diagnosis not present

## 2019-09-29 DIAGNOSIS — S56812A Strain of other muscles, fascia and tendons at forearm level, left arm, initial encounter: Secondary | ICD-10-CM | POA: Diagnosis not present

## 2019-09-29 DIAGNOSIS — M19022 Primary osteoarthritis, left elbow: Secondary | ICD-10-CM | POA: Diagnosis not present

## 2019-10-05 DIAGNOSIS — K5904 Chronic idiopathic constipation: Secondary | ICD-10-CM | POA: Diagnosis not present

## 2019-10-05 DIAGNOSIS — Z1211 Encounter for screening for malignant neoplasm of colon: Secondary | ICD-10-CM | POA: Diagnosis not present

## 2019-10-19 DIAGNOSIS — Z4689 Encounter for fitting and adjustment of other specified devices: Secondary | ICD-10-CM | POA: Diagnosis not present

## 2019-10-19 DIAGNOSIS — N8110 Cystocele, unspecified: Secondary | ICD-10-CM | POA: Diagnosis not present

## 2019-10-19 HISTORY — PX: TOTAL HIP ARTHROPLASTY: SHX124

## 2019-10-20 DIAGNOSIS — H00025 Hordeolum internum left lower eyelid: Secondary | ICD-10-CM | POA: Diagnosis not present

## 2019-10-26 DIAGNOSIS — Z01818 Encounter for other preprocedural examination: Secondary | ICD-10-CM | POA: Diagnosis not present

## 2019-11-06 DIAGNOSIS — M1612 Unilateral primary osteoarthritis, left hip: Secondary | ICD-10-CM | POA: Diagnosis not present

## 2019-11-13 DIAGNOSIS — M25552 Pain in left hip: Secondary | ICD-10-CM | POA: Diagnosis not present

## 2019-11-13 DIAGNOSIS — Z96642 Presence of left artificial hip joint: Secondary | ICD-10-CM | POA: Diagnosis not present

## 2019-11-13 DIAGNOSIS — R262 Difficulty in walking, not elsewhere classified: Secondary | ICD-10-CM | POA: Diagnosis not present

## 2019-11-13 DIAGNOSIS — Z471 Aftercare following joint replacement surgery: Secondary | ICD-10-CM | POA: Diagnosis not present

## 2019-11-22 DIAGNOSIS — R262 Difficulty in walking, not elsewhere classified: Secondary | ICD-10-CM | POA: Diagnosis not present

## 2019-11-22 DIAGNOSIS — M25552 Pain in left hip: Secondary | ICD-10-CM | POA: Diagnosis not present

## 2019-11-22 DIAGNOSIS — Z96642 Presence of left artificial hip joint: Secondary | ICD-10-CM | POA: Diagnosis not present

## 2019-11-22 DIAGNOSIS — Z471 Aftercare following joint replacement surgery: Secondary | ICD-10-CM | POA: Diagnosis not present

## 2019-12-04 DIAGNOSIS — Z9882 Breast implant status: Secondary | ICD-10-CM | POA: Diagnosis not present

## 2019-12-04 DIAGNOSIS — R928 Other abnormal and inconclusive findings on diagnostic imaging of breast: Secondary | ICD-10-CM | POA: Diagnosis not present

## 2019-12-04 DIAGNOSIS — Z853 Personal history of malignant neoplasm of breast: Secondary | ICD-10-CM | POA: Diagnosis not present

## 2019-12-06 DIAGNOSIS — Z96642 Presence of left artificial hip joint: Secondary | ICD-10-CM | POA: Diagnosis not present

## 2020-01-08 DIAGNOSIS — Z4689 Encounter for fitting and adjustment of other specified devices: Secondary | ICD-10-CM | POA: Diagnosis not present

## 2020-01-08 DIAGNOSIS — N950 Postmenopausal bleeding: Secondary | ICD-10-CM | POA: Diagnosis not present

## 2020-01-08 DIAGNOSIS — N8501 Benign endometrial hyperplasia: Secondary | ICD-10-CM | POA: Diagnosis not present

## 2020-01-15 ENCOUNTER — Other Ambulatory Visit: Payer: Self-pay | Admitting: Obstetrics

## 2020-01-25 ENCOUNTER — Encounter (HOSPITAL_COMMUNITY)
Admission: RE | Admit: 2020-01-25 | Discharge: 2020-01-25 | Disposition: A | Discharge status: Home / Self Care | Payer: BC Managed Care – PPO | Source: Ambulatory Visit | Attending: Obstetrics | Admitting: Obstetrics

## 2020-01-25 ENCOUNTER — Encounter (HOSPITAL_COMMUNITY): Payer: Self-pay

## 2020-01-25 ENCOUNTER — Other Ambulatory Visit: Payer: Self-pay

## 2020-01-25 DIAGNOSIS — Z01812 Encounter for preprocedural laboratory examination: Secondary | ICD-10-CM | POA: Insufficient documentation

## 2020-01-25 HISTORY — DX: Other specified postprocedural states: Z98.890

## 2020-01-25 HISTORY — DX: Other complications of anesthesia, initial encounter: T88.59XA

## 2020-01-25 HISTORY — DX: Nausea with vomiting, unspecified: R11.2

## 2020-01-25 LAB — BASIC METABOLIC PANEL
Anion gap: 8 (ref 5–15)
BUN: 18 mg/dL (ref 8–23)
CO2: 27 mmol/L (ref 22–32)
Calcium: 9.1 mg/dL (ref 8.9–10.3)
Chloride: 104 mmol/L (ref 98–111)
Creatinine, Ser: 0.61 mg/dL (ref 0.44–1.00)
GFR calc non Af Amer: >60 mL/min (ref >60)
Glucose, Bld: 98 mg/dL (ref 70–99)
Potassium: 3.9 mmol/L (ref 3.5–5.1)
Sodium: 139 mmol/L (ref 135–145)

## 2020-01-25 LAB — CBC
HCT: 40.3 % (ref 36.0–46.0)
Hemoglobin: 12.9 g/dL (ref 12.0–15.0)
MCH: 29.1 pg (ref 26.0–34.0)
MCHC: 32 g/dL (ref 30.0–36.0)
MCV: 90.8 fL (ref 80.0–100.0)
Platelets: 258 10*3/uL (ref 150–400)
RBC: 4.44 MIL/uL (ref 3.87–5.11)
RDW: 13.3 % (ref 11.5–15.5)
WBC: 6.3 10*3/uL (ref 4.0–10.5)
nRBC: 0 % (ref 0.0–0.2)

## 2020-01-25 LAB — TYPE AND SCREEN
ABO/RH(D): A NEG
Antibody Screen: NEGATIVE

## 2020-01-25 NOTE — Progress Notes (Signed)
COVID Vaccine Completed: yes Date COVID Vaccine completed: 05/29/19 COVID vaccine manufacturer:   Moderna     PCP - Dr. Deland Pretty. Cardiologist - No  Chest x-ray -  EKG -  Stress Test -  ECHO -  Cardiac Cath -  Pacemaker/ICD device last checked:  Sleep Study -  CPAP -   Fasting Blood Sugar -  Checks Blood Sugar _____ times a day  Blood Thinner Instructions: Aspirin Instructions: Last Dose:  Anesthesia review:   Patient denies shortness of breath, fever, cough and chest pain at PAT appointment   Patient verbalized understanding of instructions that were given to them at the PAT appointment. Patient was also instructed that they will need to review over the PAT instructions again at home before surgery.

## 2020-01-25 NOTE — Patient Instructions (Addendum)
DUE TO COVID-19 ONLY ONE VISITOR IS ALLOWED IN WAITING ROOM (VISITOR WILL HAVE A TEMPERATURE CHECK ON ARRIVAL AND MUST WEAR A FACE MASK THE ENTIRE TIME.)  ONCE YOU ARE ADMITTED TO YOUR PRIVATE ROOM, THE SAME ONE VISITOR IS ALLOWED TO VISIT DURING VISITING HOURS ONLY.  Your COVID swab testing is scheduled for: 02/05/20 at 8:00 am , You must self quarantine after your testing per handout given to you at the testing site. [address] [place],  [address]  (Must self quarantine after testing. Follow instructions on handout.)       Your procedure is scheduled on: 02/06/20  Report to [facility] AT : 5:30 A. M.   Call this number if you have problems the morning of surgery:[phone].   OUR ADDRESS IS [address]  WE ARE LOCATED IN THE [place]                                   [place] [place].                                     REMEMBER:   DO NOT EAT FOOD OR DRINK LIQUIDS AFTER MIDNIGHT .    BRUSH YOUR TEETH THE MORNING OF SURGERY.  TAKE THESE MEDICATIONS MORNING OF SURGERY WITH A SIP OF WATER: anastrozole,allegra.  DO NOT WEAR JEWERLY, MAKE UP, OR NAIL POLISH.  DO NOT WEAR LOTIONS, POWDERS, PERFUMES/COLOGNE OR DEODORANT.  DO NOT SHAVE FOR 24 HOURS PRIOR TO DAY OF SURGERY.  MEN MAY SHAVE FACE AND NECK.  CONTACTS, GLASSES, OR DENTURES MAY NOT BE WORN TO SURGERY.                                    [facility] IS NOT RESPONSIBLE  FOR ANY BELONGINGS.          YOU MAY BRING A SMALL OVERNIGHT [facility] - Preparing for Surgery Before surgery, you can play an important role.  Because skin is not sterile, your skin needs to be as free of germs as possible.  You can reduce the number of germs on your skin by washing with CHG (chlorahexidine gluconate) soap before surgery.  CHG is an antiseptic cleaner which kills germs and bonds with the skin to continue killing germs even after washing. Please  DO NOT use if you have an allergy to CHG or antibacterial soaps.  If your skin becomes reddened/irritated stop using the CHG and inform your nurse when you arrive at Short Stay. Do not shave (including legs and underarms) for at least 48 hours prior to the first CHG shower.  You may shave your face/neck. Please follow these instructions carefully:  1.  Shower with CHG Soap the night before surgery and the  morning of Surgery.  2.  If you choose to wash your hair, wash your hair first as usual with your  normal  shampoo.  3.  After you shampoo, rinse your hair and body thoroughly to remove the  shampoo.                           4.  Use CHG as you would any other liquid soap.  You can apply chg directly  to the skin and wash                       Gently with a scrungie or clean washcloth.  5.  Apply the CHG Soap to your body ONLY FROM THE NECK DOWN.   Do not use on face/ open                           Wound or open sores. Avoid contact with eyes, ears mouth and genitals (private parts).                       Wash face,  Genitals (private parts) with your normal soap.             6.  Wash thoroughly, paying special attention to the area where your surgery  will be performed.  7.  Thoroughly rinse your body with warm water from the neck down.  8.  DO NOT shower/wash with your normal soap after using and rinsing off  the CHG Soap.                9.  Pat yourself dry with a clean towel.            10.  Wear clean pajamas.            11.  Place clean sheets on your bed the night of your first shower and do not  sleep with pets. Day of Surgery : Do not apply any lotions/deodorants the morning of surgery.  Please wear clean clothes to the hospital/surgery center.  FAILURE TO FOLLOW THESE INSTRUCTIONS MAY RESULT IN THE CANCELLATION OF YOUR SURGERY PATIENT SIGNATURE_________________________________  NURSE  SIGNATURE__________________________________  ________________________________________________________________________

## 2020-02-05 ENCOUNTER — Other Ambulatory Visit (HOSPITAL_COMMUNITY)
Admission: RE | Admit: 2020-02-05 | Discharge: 2020-02-05 | Disposition: A | Discharge status: Home / Self Care | Payer: BC Managed Care – PPO | Source: Ambulatory Visit | Attending: Obstetrics | Admitting: Obstetrics

## 2020-02-05 DIAGNOSIS — Z20822 Contact with and (suspected) exposure to covid-19: Secondary | ICD-10-CM | POA: Diagnosis not present

## 2020-02-05 DIAGNOSIS — Z01812 Encounter for preprocedural laboratory examination: Secondary | ICD-10-CM | POA: Insufficient documentation

## 2020-02-05 LAB — SARS CORONAVIRUS 2 (TAT 6-24 HRS): SARS Coronavirus 2: NEGATIVE

## 2020-02-06 ENCOUNTER — Observation Stay (HOSPITAL_BASED_OUTPATIENT_CLINIC_OR_DEPARTMENT_OTHER)
Admission: RE | Admit: 2020-02-06 | Discharge: 2020-02-07 | Disposition: A | Discharge status: Home / Self Care | Payer: BC Managed Care – PPO | Attending: Obstetrics | Admitting: Obstetrics

## 2020-02-06 ENCOUNTER — Encounter (HOSPITAL_BASED_OUTPATIENT_CLINIC_OR_DEPARTMENT_OTHER)
Admission: RE | Disposition: A | Discharge status: Home / Self Care | Payer: Self-pay | Source: Home / Self Care | Attending: Obstetrics

## 2020-02-06 ENCOUNTER — Ambulatory Visit (HOSPITAL_BASED_OUTPATIENT_CLINIC_OR_DEPARTMENT_OTHER): Payer: BC Managed Care – PPO | Admitting: Certified Registered"

## 2020-02-06 ENCOUNTER — Other Ambulatory Visit: Payer: Self-pay

## 2020-02-06 ENCOUNTER — Encounter (HOSPITAL_BASED_OUTPATIENT_CLINIC_OR_DEPARTMENT_OTHER): Payer: Self-pay | Admitting: Obstetrics

## 2020-02-06 DIAGNOSIS — Z853 Personal history of malignant neoplasm of breast: Secondary | ICD-10-CM | POA: Diagnosis not present

## 2020-02-06 DIAGNOSIS — Z90710 Acquired absence of both cervix and uterus: Secondary | ICD-10-CM | POA: Insufficient documentation

## 2020-02-06 DIAGNOSIS — N8500 Endometrial hyperplasia, unspecified: Secondary | ICD-10-CM | POA: Diagnosis not present

## 2020-02-06 DIAGNOSIS — N800 Endometriosis of uterus: Secondary | ICD-10-CM | POA: Diagnosis not present

## 2020-02-06 DIAGNOSIS — N393 Stress incontinence (female) (male): Secondary | ICD-10-CM | POA: Insufficient documentation

## 2020-02-06 DIAGNOSIS — N8110 Cystocele, unspecified: Secondary | ICD-10-CM | POA: Insufficient documentation

## 2020-02-06 DIAGNOSIS — N8111 Cystocele, midline: Secondary | ICD-10-CM | POA: Diagnosis not present

## 2020-02-06 DIAGNOSIS — N8501 Benign endometrial hyperplasia: Secondary | ICD-10-CM | POA: Diagnosis not present

## 2020-02-06 DIAGNOSIS — Z90721 Acquired absence of ovaries, unilateral: Secondary | ICD-10-CM | POA: Diagnosis present

## 2020-02-06 HISTORY — PX: BLADDER SUSPENSION: SHX72

## 2020-02-06 HISTORY — PX: ROBOTIC ASSISTED TOTAL HYSTERECTOMY WITH BILATERAL SALPINGO OOPHERECTOMY: SHX6086

## 2020-02-06 HISTORY — PX: CYSTOSCOPY: SHX5120

## 2020-02-06 HISTORY — PX: CYSTOCELE REPAIR: SHX163

## 2020-02-06 LAB — ABO/RH: ABO/RH(D): A NEG

## 2020-02-06 SURGERY — HYSTERECTOMY, TOTAL, ROBOT-ASSISTED, LAPAROSCOPIC, WITH BILATERAL SALPINGO-OOPHORECTOMY
Anesthesia: General

## 2020-02-06 MED ORDER — CEFAZOLIN SODIUM-DEXTROSE 2-4 GM/100ML-% IV SOLN
INTRAVENOUS | Status: AC
  Filled 2020-02-06: qty 100

## 2020-02-06 MED ORDER — ONDANSETRON HCL 4 MG PO TABS: 4.0000 mg | ORAL_TABLET | Freq: Four times a day (QID) | ORAL | Status: DC | PRN

## 2020-02-06 MED ORDER — LIDOCAINE 2% (20 MG/ML) 5 ML SYRINGE
INTRAMUSCULAR | Status: AC
  Filled 2020-02-06: qty 5

## 2020-02-06 MED ORDER — ROCURONIUM BROMIDE 10 MG/ML (PF) SYRINGE
PREFILLED_SYRINGE | INTRAVENOUS | Status: AC
  Filled 2020-02-06: qty 10

## 2020-02-06 MED ORDER — IBUPROFEN 800 MG PO TABS: 800.0000 mg | ORAL_TABLET | Freq: Four times a day (QID) | ORAL | Status: DC

## 2020-02-06 MED ORDER — ZOLPIDEM TARTRATE 5 MG PO TABS: 5.0000 mg | ORAL_TABLET | Freq: Every evening | ORAL | Status: DC | PRN

## 2020-02-06 MED ORDER — KETOROLAC TROMETHAMINE 30 MG/ML IJ SOLN
INTRAMUSCULAR | Status: AC
  Filled 2020-02-06: qty 1

## 2020-02-06 MED ORDER — OXYCODONE HCL 5 MG PO TABS
ORAL_TABLET | ORAL | Status: AC
  Filled 2020-02-06: qty 1

## 2020-02-06 MED ORDER — DROPERIDOL 2.5 MG/ML IJ SOLN
INTRAMUSCULAR | Status: DC | PRN
  Administered 2020-02-06: .625 mg via INTRAVENOUS

## 2020-02-06 MED ORDER — LIDOCAINE 2% (20 MG/ML) 5 ML SYRINGE
INTRAMUSCULAR | Status: DC | PRN
  Administered 2020-02-06: 1.5 mg/kg/h via INTRAVENOUS

## 2020-02-06 MED ORDER — BUTORPHANOL TARTRATE 2 MG/ML IJ SOLN: 1.0000 mg | INTRAMUSCULAR | Status: DC | PRN

## 2020-02-06 MED ORDER — ONDANSETRON HCL 4 MG/2ML IJ SOLN: 4.0000 mg | Freq: Four times a day (QID) | INTRAMUSCULAR | Status: DC | PRN

## 2020-02-06 MED ORDER — MENTHOL 3 MG MT LOZG: 1.0000 | LOZENGE | OROMUCOSAL | Status: DC | PRN

## 2020-02-06 MED ORDER — LACTATED RINGERS IV SOLN: INTRAVENOUS | Status: DC

## 2020-02-06 MED ORDER — HYDROMORPHONE HCL 1 MG/ML IJ SOLN
0.2500 mg | INTRAMUSCULAR | Status: DC | PRN
  Administered 2020-02-06 (×3): 0.5 mg via INTRAVENOUS

## 2020-02-06 MED ORDER — FENTANYL CITRATE (PF) 100 MCG/2ML IJ SOLN
INTRAMUSCULAR | Status: AC
  Filled 2020-02-06: qty 2

## 2020-02-06 MED ORDER — DEXAMETHASONE SODIUM PHOSPHATE 10 MG/ML IJ SOLN
INTRAMUSCULAR | Status: AC
  Filled 2020-02-06: qty 1

## 2020-02-06 MED ORDER — OXYCODONE HCL 5 MG PO TABS
5.0000 mg | ORAL_TABLET | ORAL | Status: DC | PRN
  Administered 2020-02-06: 10 mg via ORAL
  Administered 2020-02-06: 5 mg via ORAL
  Administered 2020-02-07 (×2): 10 mg via ORAL

## 2020-02-06 MED ORDER — LIDOCAINE-EPINEPHRINE 1 %-1:100000 IJ SOLN
INTRAMUSCULAR | Status: DC | PRN
  Administered 2020-02-06: 20 mL

## 2020-02-06 MED ORDER — HYDROMORPHONE HCL 1 MG/ML IJ SOLN
INTRAMUSCULAR | Status: AC
  Filled 2020-02-06: qty 1

## 2020-02-06 MED ORDER — ONDANSETRON HCL 4 MG/2ML IJ SOLN
INTRAMUSCULAR | Status: AC
  Filled 2020-02-06: qty 2

## 2020-02-06 MED ORDER — INDIGOTINDISULFONATE SODIUM 8 MG/ML IJ SOLN
INTRAMUSCULAR | Status: DC | PRN
  Administered 2020-02-06: 5 mL via INTRAVENOUS

## 2020-02-06 MED ORDER — DROPERIDOL 2.5 MG/ML IJ SOLN
INTRAMUSCULAR | Status: AC
  Filled 2020-02-06: qty 2

## 2020-02-06 MED ORDER — SUGAMMADEX SODIUM 200 MG/2ML IV SOLN
INTRAVENOUS | Status: DC | PRN
  Administered 2020-02-06: 200 mg via INTRAVENOUS

## 2020-02-06 MED ORDER — STERILE WATER FOR IRRIGATION IR SOLN
Status: DC | PRN
  Administered 2020-02-06: 500 mL

## 2020-02-06 MED ORDER — OXYCODONE HCL 5 MG/5ML PO SOLN: 5.0000 mg | Freq: Once | ORAL | Status: DC | PRN

## 2020-02-06 MED ORDER — OXYCODONE HCL 5 MG PO TABS: 5.0000 mg | ORAL_TABLET | Freq: Once | ORAL | Status: DC | PRN

## 2020-02-06 MED ORDER — PROPOFOL 10 MG/ML IV BOLUS
INTRAVENOUS | Status: DC | PRN
  Administered 2020-02-06: 180 mg via INTRAVENOUS

## 2020-02-06 MED ORDER — KETOROLAC TROMETHAMINE 30 MG/ML IJ SOLN
30.0000 mg | Freq: Four times a day (QID) | INTRAMUSCULAR | Status: DC
  Administered 2020-02-06 – 2020-02-07 (×3): 30 mg via INTRAVENOUS

## 2020-02-06 MED ORDER — SIMETHICONE 80 MG PO CHEW
CHEWABLE_TABLET | ORAL | Status: AC
  Filled 2020-02-06: qty 1

## 2020-02-06 MED ORDER — KETAMINE HCL 10 MG/ML IJ SOLN
INTRAMUSCULAR | Status: AC
  Filled 2020-02-06: qty 1

## 2020-02-06 MED ORDER — ONDANSETRON HCL 4 MG/2ML IJ SOLN
INTRAMUSCULAR | Status: DC | PRN
  Administered 2020-02-06: 4 mg via INTRAVENOUS

## 2020-02-06 MED ORDER — PANTOPRAZOLE SODIUM 40 MG PO TBEC
40.0000 mg | DELAYED_RELEASE_TABLET | Freq: Every day | ORAL | Status: DC
  Administered 2020-02-06: 40 mg via ORAL

## 2020-02-06 MED ORDER — POVIDONE-IODINE 10 % EX SWAB: 2.0000 "application " | Freq: Once | CUTANEOUS | Status: DC

## 2020-02-06 MED ORDER — PANTOPRAZOLE SODIUM 40 MG PO TBEC
DELAYED_RELEASE_TABLET | ORAL | Status: AC
  Filled 2020-02-06: qty 1

## 2020-02-06 MED ORDER — MIDAZOLAM HCL 5 MG/5ML IJ SOLN
INTRAMUSCULAR | Status: DC | PRN
  Administered 2020-02-06: 2 mg via INTRAVENOUS

## 2020-02-06 MED ORDER — FENTANYL CITRATE (PF) 100 MCG/2ML IJ SOLN
INTRAMUSCULAR | Status: DC | PRN
Start: 2020-02-06 — End: 2020-02-06
  Administered 2020-02-06 (×2): 50 ug via INTRAVENOUS
  Administered 2020-02-06: 100 ug via INTRAVENOUS

## 2020-02-06 MED ORDER — KETAMINE HCL 10 MG/ML IJ SOLN
INTRAMUSCULAR | Status: DC | PRN
  Administered 2020-02-06: 35 mg via INTRAVENOUS

## 2020-02-06 MED ORDER — BUPIVACAINE HCL (PF) 0.25 % IJ SOLN
INTRAMUSCULAR | Status: DC | PRN
  Administered 2020-02-06: 20 mL

## 2020-02-06 MED ORDER — SODIUM CHLORIDE (PF) 0.9 % IJ SOLN
INTRAMUSCULAR | Status: DC | PRN
  Administered 2020-02-06: 50 mL

## 2020-02-06 MED ORDER — ROCURONIUM BROMIDE 10 MG/ML (PF) SYRINGE
PREFILLED_SYRINGE | INTRAVENOUS | Status: DC | PRN
  Administered 2020-02-06: 70 mg via INTRAVENOUS
  Administered 2020-02-06: 30 mg via INTRAVENOUS

## 2020-02-06 MED ORDER — SODIUM CHLORIDE 0.9 % IV SOLN
INTRAVENOUS | Status: DC | PRN
  Administered 2020-02-06: 60 mL

## 2020-02-06 MED ORDER — SIMETHICONE 80 MG PO CHEW
80.0000 mg | CHEWABLE_TABLET | Freq: Four times a day (QID) | ORAL | Status: DC | PRN
  Administered 2020-02-06 (×2): 80 mg via ORAL

## 2020-02-06 MED ORDER — SODIUM CHLORIDE (PF) 0.9 % IJ SOLN
INTRAMUSCULAR | Status: AC
  Filled 2020-02-06: qty 10

## 2020-02-06 MED ORDER — DEXAMETHASONE SODIUM PHOSPHATE 10 MG/ML IJ SOLN
INTRAMUSCULAR | Status: DC | PRN
  Administered 2020-02-06: 10 mg via INTRAVENOUS

## 2020-02-06 MED ORDER — SCOPOLAMINE 1 MG/3DAYS TD PT72
MEDICATED_PATCH | TRANSDERMAL | Status: AC
  Filled 2020-02-06: qty 1

## 2020-02-06 MED ORDER — LIDOCAINE 2% (20 MG/ML) 5 ML SYRINGE
INTRAMUSCULAR | Status: DC | PRN
  Administered 2020-02-06: 80 mg via INTRAVENOUS

## 2020-02-06 MED ORDER — OXYCODONE HCL 5 MG PO TABS
ORAL_TABLET | ORAL | Status: AC
  Filled 2020-02-06: qty 2

## 2020-02-06 MED ORDER — PROPOFOL 10 MG/ML IV BOLUS
INTRAVENOUS | Status: AC
  Filled 2020-02-06: qty 20

## 2020-02-06 MED ORDER — KETOROLAC TROMETHAMINE 30 MG/ML IJ SOLN
30.0000 mg | Freq: Once | INTRAMUSCULAR | Status: AC | PRN
  Administered 2020-02-06: 30 mg via INTRAVENOUS

## 2020-02-06 MED ORDER — SCOPOLAMINE 1 MG/3DAYS TD PT72
MEDICATED_PATCH | TRANSDERMAL | Status: DC | PRN
  Administered 2020-02-06: 1 via TRANSDERMAL

## 2020-02-06 MED ORDER — CEFAZOLIN SODIUM-DEXTROSE 2-4 GM/100ML-% IV SOLN
2.0000 g | INTRAVENOUS | Status: AC
  Administered 2020-02-06: 2 g via INTRAVENOUS

## 2020-02-06 MED ORDER — MIDAZOLAM HCL 2 MG/2ML IJ SOLN
INTRAMUSCULAR | Status: AC
  Filled 2020-02-06: qty 2

## 2020-02-06 SURGICAL SUPPLY — 89 items
ADH SKN CLS APL DERMABOND .7 (GAUZE/BANDAGES/DRESSINGS) ×2
APL PRP STRL LF DISP 70% ISPRP (MISCELLANEOUS) ×2
BARRIER ADHS 3X4 INTERCEED (GAUZE/BANDAGES/DRESSINGS) IMPLANT
BLADE SURG 11 STRL SS (BLADE) ×3 IMPLANT
BLADE SURG 15 STRL LF DISP TIS (BLADE) ×2 IMPLANT
BLADE SURG 15 STRL SS (BLADE) ×3
BRR ADH 4X3 ABS CNTRL BYND (GAUZE/BANDAGES/DRESSINGS)
CANISTER SUCT 3000ML PPV (MISCELLANEOUS) ×3 IMPLANT
CATH FOLEY 2WAY SLVR  5CC 18FR (CATHETERS) ×3
CATH FOLEY 2WAY SLVR 5CC 18FR (CATHETERS) ×2 IMPLANT
CHLORAPREP W/TINT 26 (MISCELLANEOUS) ×3 IMPLANT
CNTNR URN SCR LID CUP LEK RST (MISCELLANEOUS) ×2 IMPLANT
CONT SPEC 4OZ STRL OR WHT (MISCELLANEOUS) ×3
COVER BACK TABLE 60X90IN (DRAPES) ×3 IMPLANT
COVER TIP SHEARS 8 DVNC (MISCELLANEOUS) ×2 IMPLANT
COVER TIP SHEARS 8MM DA VINCI (MISCELLANEOUS) ×3
DECANTER SPIKE VIAL GLASS SM (MISCELLANEOUS) ×6 IMPLANT
DEFOGGER SCOPE WARMER CLEARIFY (MISCELLANEOUS) ×3 IMPLANT
DERMABOND ADVANCED (GAUZE/BANDAGES/DRESSINGS) ×1
DERMABOND ADVANCED .7 DNX12 (GAUZE/BANDAGES/DRESSINGS) ×2 IMPLANT
DEVICE CAPIO SLIM SINGLE (INSTRUMENTS) IMPLANT
DRAPE ARM DVNC X/XI (DISPOSABLE) ×8 IMPLANT
DRAPE COLUMN DVNC XI (DISPOSABLE) ×2 IMPLANT
DRAPE DA VINCI XI ARM (DISPOSABLE) ×12
DRAPE DA VINCI XI COLUMN (DISPOSABLE) ×3
DRAPE UTILITY XL STRL (DRAPES) ×3 IMPLANT
ELECT REM PT RETURN 9FT ADLT (ELECTROSURGICAL) ×3
ELECTRODE REM PT RTRN 9FT ADLT (ELECTROSURGICAL) ×2 IMPLANT
GAUZE PACKING 1 X5 YD ST (GAUZE/BANDAGES/DRESSINGS) IMPLANT
GAUZE PACKING 2X5 YD STRL (GAUZE/BANDAGES/DRESSINGS) IMPLANT
GLOVE BIO SURGEON STRL SZ 6.5 (GLOVE) ×9 IMPLANT
GLOVE BIOGEL PI IND STRL 7.0 (GLOVE) ×10 IMPLANT
GLOVE BIOGEL PI INDICATOR 7.0 (GLOVE) ×5
GOWN STRL REUS W/ TWL LRG LVL3 (GOWN DISPOSABLE) ×8 IMPLANT
GOWN STRL REUS W/TWL LRG LVL3 (GOWN DISPOSABLE) ×12
HIBICLENS CHG 4% 4OZ (MISCELLANEOUS) ×3 IMPLANT
HOLDER FOLEY CATH W/STRAP (MISCELLANEOUS) IMPLANT
IRRIG SUCT STRYKERFLOW 2 WTIP (MISCELLANEOUS) ×3
IRRIGATION SUCT STRKRFLW 2 WTP (MISCELLANEOUS) ×2 IMPLANT
IV NS 1000ML (IV SOLUTION) ×3
IV NS 1000ML BAXH (IV SOLUTION) ×2 IMPLANT
KIT TURNOVER CYSTO (KITS) ×3 IMPLANT
LEGGING LITHOTOMY PAIR STRL (DRAPES) ×3 IMPLANT
NDL SPNL 22GX3.5 QUINCKE BK (NEEDLE) ×2 IMPLANT
NEEDLE HYPO 22GX1.5 SAFETY (NEEDLE) ×3 IMPLANT
NEEDLE SPNL 22GX3.5 QUINCKE BK (NEEDLE) ×3 IMPLANT
NS IRRIG 1000ML POUR BTL (IV SOLUTION) ×3 IMPLANT
OBTURATOR OPTICAL STANDARD 8MM (TROCAR) ×3
OBTURATOR OPTICAL STND 8 DVNC (TROCAR) ×2
OBTURATOR OPTICALSTD 8 DVNC (TROCAR) ×2 IMPLANT
OCCLUDER COLPOPNEUMO (BALLOONS) ×3 IMPLANT
PACK ROBOT WH (CUSTOM PROCEDURE TRAY) ×3 IMPLANT
PACK ROBOTIC GOWN (GOWN DISPOSABLE) ×3 IMPLANT
PACK TRENDGUARD 450 HYBRID PRO (MISCELLANEOUS) IMPLANT
PACK VAGINAL WOMENS (CUSTOM PROCEDURE TRAY) ×3 IMPLANT
PAD OB MATERNITY 4.3X12.25 (PERSONAL CARE ITEMS) ×3 IMPLANT
PAD PREP 24X48 CUFFED NSTRL (MISCELLANEOUS) ×3 IMPLANT
PROTECTOR NERVE ULNAR (MISCELLANEOUS) ×6 IMPLANT
SEAL CANN UNIV 5-8 DVNC XI (MISCELLANEOUS) ×8 IMPLANT
SEAL XI 5MM-8MM UNIVERSAL (MISCELLANEOUS) ×12
SEALER VESSEL DA VINCI XI (MISCELLANEOUS) ×3
SEALER VESSEL EXT DVNC XI (MISCELLANEOUS) ×2 IMPLANT
SET IRRIG Y TYPE TUR BLADDER L (SET/KITS/TRAYS/PACK) ×3 IMPLANT
SET TRI-LUMEN FLTR TB AIRSEAL (TUBING) ×3 IMPLANT
SLING TVT EXACT (Sling) ×1 IMPLANT
SUT CAPIO ETHIBPND (SUTURE) IMPLANT
SUT VIC AB 0 CT1 18XCR BRD8 (SUTURE) IMPLANT
SUT VIC AB 0 CT1 27 (SUTURE) ×6
SUT VIC AB 0 CT1 27XBRD ANBCTR (SUTURE) ×4 IMPLANT
SUT VIC AB 0 CT1 8-18 (SUTURE) ×3
SUT VIC AB 2-0 CT1 27 (SUTURE) ×6
SUT VIC AB 2-0 CT1 TAPERPNT 27 (SUTURE) ×2 IMPLANT
SUT VIC AB 2-0 UR5 27 (SUTURE) IMPLANT
SUT VIC AB 4-0 PS2 27 (SUTURE) ×6 IMPLANT
SUT VICRYL 0 UR6 27IN ABS (SUTURE) ×3 IMPLANT
SUT VLOC 180 0 9IN  GS21 (SUTURE) ×3
SUT VLOC 180 0 9IN GS21 (SUTURE) ×2 IMPLANT
SYR 10ML LL (SYRINGE) IMPLANT
SYR 20ML LL LF (SYRINGE) ×3 IMPLANT
SYR BULB IRRIG 60ML STRL (SYRINGE) ×3 IMPLANT
TIP RUMI ORANGE 6.7MMX12CM (TIP) IMPLANT
TIP UTERINE 5.1X6CM LAV DISP (MISCELLANEOUS) IMPLANT
TIP UTERINE 6.7X10CM GRN DISP (MISCELLANEOUS) IMPLANT
TIP UTERINE 6.7X6CM WHT DISP (MISCELLANEOUS) IMPLANT
TIP UTERINE 6.7X8CM BLUE DISP (MISCELLANEOUS) IMPLANT
TOWEL OR 17X26 10 PK STRL BLUE (TOWEL DISPOSABLE) ×6 IMPLANT
TRAY FOLEY W/BAG SLVR 14FR LF (SET/KITS/TRAYS/PACK) ×3 IMPLANT
TRENDGUARD 450 HYBRID PRO PACK (MISCELLANEOUS)
TROCAR PORT AIRSEAL 5X120 (TROCAR) ×3 IMPLANT

## 2020-02-06 NOTE — Anesthesia Preprocedure Evaluation (Signed)
Anesthesia Evaluation  Patient identified by MRN, date of birth, ID band Patient awake    Reviewed: Allergy & Precautions, NPO status , Patient's Chart, lab work & pertinent test results  History of Anesthesia Complications (+) PONV  Airway Mallampati: II  TM Distance: >3 FB Neck ROM: Full    Dental no notable dental hx.    Pulmonary neg pulmonary ROS,    Pulmonary exam normal breath sounds clear to auscultation       Cardiovascular negative cardio ROS Normal cardiovascular exam Rhythm:Regular Rate:Normal     Neuro/Psych negative neurological ROS  negative psych ROS   GI/Hepatic negative GI ROS, Neg liver ROS,   Endo/Other  negative endocrine ROS  Renal/GU negative Renal ROS  negative genitourinary   Musculoskeletal negative musculoskeletal ROS (+)   Abdominal   Peds negative pediatric ROS (+)  Hematology negative hematology ROS (+)   Anesthesia Other Findings   Reproductive/Obstetrics negative OB ROS                             Anesthesia Physical Anesthesia Plan  ASA: II  Anesthesia Plan: General   Post-op Pain Management:    Induction: Intravenous  PONV Risk Score and Plan: 4 or greater and Ondansetron, Dexamethasone, Droperidol, Scopolamine patch - Pre-op and Treatment may vary due to age or medical condition  Airway Management Planned: Oral ETT  Additional Equipment:   Intra-op Plan:   Post-operative Plan: Extubation in OR  Informed Consent: I have reviewed the patients History and Physical, chart, labs and discussed the procedure including the risks, benefits and alternatives for the proposed anesthesia with the patient or authorized representative who has indicated his/her understanding and acceptance.     Dental advisory given  Plan Discussed with: CRNA and Surgeon  Anesthesia Plan Comments:         Anesthesia Quick Evaluation

## 2020-02-06 NOTE — Brief Op Note (Signed)
02/06/2020  11:26 AM  PATIENT:  Christina Rivera  63 y.o. female  PRE-OPERATIVE DIAGNOSIS:  Cystocele, Stress Urinary Incontinence, History of Breast Cancer, Endometrial Hyperplasia  POST-OPERATIVE DIAGNOSIS:  Cystocele, Stress Urinary Incontinence, History of Breast Cancer, Endometrial Hyperplasia  PROCEDURE:  Procedure(s) with comments: XI ROBOTIC ASSISTED TOTAL HYSTERECTOMY WITH BILATERAL SALPINGO OOPHORECTOMY (Bilateral) - Requests 4 1/2 hrs. TRANSVAGINAL TAPE (TVT) PROCEDURE (N/A) CYSTOSCOPY (N/A) ANTERIOR REPAIR (CYSTOCELE) (N/A)  SURGEON:  Surgeon(s) and Role:    * Aloha Gell, MD - Primary    * Law, Cassandra A, DO - Assisting  PHYSICIAN ASSISTANT:   ASSISTANTS: As above  ANESTHESIA:   local and general  EBL:  50 mL   BLOOD ADMINISTERED:none  DRAINS: Urinary Catheter (Foley)   LOCAL MEDICATIONS USED:  MARCAINE    and BUPIVICAINE   SPECIMEN:  Source of Specimen:  Uterus, cervix, bilateral fallopian tubes and ovaries  DISPOSITION OF SPECIMEN:  PATHOLOGY  COUNTS:  YES  TOURNIQUET:  * No tourniquets in log *  DICTATION: .Note written in EPIC  PLAN OF CARE: Admit for overnight observation  PATIENT DISPOSITION:  PACU - hemodynamically stable.   Delay start of Pharmacological VTE agent (>24hrs) due to surgical blood loss or risk of bleeding: yes

## 2020-02-06 NOTE — Op Note (Signed)
02/06/2020  11:26 AM  PATIENT:  Christina Rivera  64 y.o. female  PRE-OPERATIVE DIAGNOSIS:  Cystocele, Stress Urinary Incontinence, History of Breast Cancer, Endometrial Hyperplasia  POST-OPERATIVE DIAGNOSIS:  Cystocele, Stress Urinary Incontinence, History of Breast Cancer, Endometrial Hyperplasia  PROCEDURE:  Procedure(s) with comments: XI ROBOTIC ASSISTED TOTAL HYSTERECTOMY WITH BILATERAL SALPINGO OOPHORECTOMY (Bilateral) - Requests 4 1/2 hrs. TRANSVAGINAL TAPE (TVT) PROCEDURE (N/A) CYSTOSCOPY (N/A) ANTERIOR REPAIR (CYSTOCELE) (N/A)  SURGEON:  Surgeon(s) and Role:    * Aloha Gell, MD - Primary    * Law, Cassandra A, DO - Assisting  PHYSICIAN ASSISTANT:   ASSISTANTS: As above  ANESTHESIA:   local and general  EBL:  50 mL   BLOOD ADMINISTERED:none  DRAINS: Urinary Catheter (Foley)   LOCAL MEDICATIONS USED:  MARCAINE    and BUPIVICAINE   SPECIMEN:  Source of Specimen:  Uterus, cervix, bilateral fallopian tubes and ovaries  DISPOSITION OF SPECIMEN:  PATHOLOGY  COUNTS:  YES  TOURNIQUET:  * No tourniquets in log *  DICTATION: .Note written in EPIC  PLAN OF CARE: Admit for overnight observation  PATIENT DISPOSITION:  PACU - hemodynamically stable.   Delay start of Pharmacological VTE agent (>24hrs) due to surgical blood loss or risk of bleeding: yes   Procedure:Robotic-assisted total laparoscopic hysterectomy with BSO, cystocele repair, TVT sling, cystoscopy Complications: none Antibiotics: 2 g Ancef Findings: nl b/l ovaries and tubes, normal uterus, normal gallbladder and liver edge, Hemostasis post-procedure with peristalsis of bilateral ureters.  Cystoscopy with no evidence of sling in the bladder and bladder filling with fresh blue urine.   Indications: This is a 64 year old patient here for robotic assisted total laparoscopic hysterectomy with bilateral salpingonephrectomy for history of breast cancer and endometrial hyperplasia on long-term SERM, as well  as cystocele repair and TVT sling for bothersome stress incontinence and cystocele.  Procedure: After informed consent obtained, the patient was taken to the operating room where general anesthesia was initiated without difficulty.   She was prepped and draped in normal sterile fashion in the dorsal supine lithotomy position. A Foley catheter was inserted sterilely into the bladder. A bimanual examination was done to assess the size and position of the uterus. A weighted speculum was placed in the vagina and deaver retractors were used on the anterior vaginal wall. .The cervix was grasped with tenaculum. The cervix was sounded to 8 cm. The cervix was assessed to identify the Rumi-Co size.  A medium cup and an 8 cm shaft was used. The uterine balloon was inflated.  Gloved were changed. Attention was then turned to the patient's abdomen. 0.5 % marcaine was used prior to all incision. A total of 15 cc of marcaine was used.  A 10 mm incision was made in the umbilicus and blunt and sharp dissection was done until the fascia was identified. This was then grasped with Kocher clamps x2 and entered sharply. A pursestring suture of 0 Vicryl was then placed along the incision and a non-bladed Sheryle Hail was inserted into the peritoneal cavity. Intraperitoneal placement was confirmed with the use of the camera and pneumoperitoneum was created to 15 mm of mercury. The pursestring suture was secured around the port and pneumoperitoneum was maintained. Brief survey of the abdomen and pelvis was done with findings as above. The abdominal wall was assessed and additional port sites were marked. 8 mm incisions were placed in the right (two) and left lower quadrants (2) and non-bladed ports were placed under direct visualization. The robot was brought to the  patient's side and attached with the right side docking. The robotic instruments were placed under direct visualization until proper placement just over the uterus.  I then  went to the robotic console. Brief survey of the patient's abdomen and pelvis revealed  findings as above.Bilateral tubes and ovaries were normal. No significant adhesions were noted.  I began on the left side: the  Left ureter and  IP were identified. The IP was serially cauterized and transected with the vessel sealer. The left round ligament was cauterized and divided.  The anterior leaf of the broad ligament was dissected bluntly then monopolar cautery was used to separate the anterior and posterior leafs of the broad ligament. This was carried down through to the bladder flap. Attention was then turned to the patient's  Right side: the right IP was serially cauterized and transected.  The right round  ligament was cauterized and transected. The anterior and posterior leaves of the broad ligament were bluntly and sharply dissected apart from each other. Monopolar cautery was used to come across the anterior leaf of the right broad ligament. This dissection was carried down across to the midline to create the bladder flap. The leftt uterine artery was serially cauterized with the vessel sealer and transected.  The right uterine artery and then serially cauterized with the vessel sealer and transected. The uterus was placed on stretch to allow better visualization of the arteries. The bladder flap was further taken down both sharply and with cautery. Cautery was used for hemostasis on several places along the bladder flap. At this point with the pressure inward on the Rumi, the anterior colpotomy was made with the monopolar scissors. This was carried around to the patient's right side. Additional bipolar cautery was used on the  both angles of the cuff- this was carried out with the vessel sealer. The uterus was then positioned  anteriorly  to allow easy access to the posterior  colpotomy. This was carried out with the monopolar scissors.  Good hemostasis was noted along the angles of the incision.  With  manipulation of the specimen, the uterus, cervix,  bilateral tubes and ovaries were pulled out through the vagina.   Irrigation was used and the vaginal cuff appeared hemostatic. A 9 inch V-lock suture was placed though the umbilical port. Instruments were changed to allow a suture cut needle driver through the #1 port and and a long-tipped forceps through the #2 port. The right angle was closed and locked with the V-lock suture. Running suture was carried out tothe L angle.  A more superficial layer of suture was secured in the left uterosacral ligament, traveled back along the vaginal cuff and secured into the right uterosacral ligament then back to the cuff..  The suture was cut and removed.  The vagina was checked to ensure good closure of the cuff.  Excellent hemostasis was noted. Suction irrigation was carried out and hemostasis was assured along the vaginal cuff. The pedicles were reassessed also found to be hemostatic. All free fluid was removed from the abdomen. The robotic portion was completed. The robot was undocked.   By standard laparoscopy the pelvis was irrigated and evaluated. The patient was placed in reverse Trendelenburg, all additional fluid was suctioned from the abdomen and pelvis the cuff was reinspected and  found to be hemostatic. All pedicles were also found to be hemostatic. 20 CC 1/4% bupivicaine was placed into the peritoneal cavity.  Under direct visualization the ports were removed. Pneumoperitoneum was released and  the umbilical port was removed.  The  pursestring suture at the umbilicus was then closed. No additional fascial incisions were closed due to the small size and the nondilated nature of these incisions. A 4-0 Vicryl was used to close the additional laparoscopic ports sites. Good hemostasis was noted.  The hysterectomy portion was complete we began on the vaginal procedures of TVT sling and cystocele repair. IV indigo carmine was given.   The mons pubis have been  shaved at the start of the case, the pubic symphysis was marked for the planned bilateral exit points, 2 cm lateral to the midline just above the a pubic symphysis.  A solution of 20 cc of 1% lidocaine with 1:100,000 units of epinephrine mixed with 50 cc of normal saline was used as the injection. 30 cc of this solution was injected into the suprapubic space bilaterally, an additional 10 cc of this solution was injected along the planned exit routes bilaterally. This placement was accomplished by placing a 20-gauge spinal needle from the planned abdominal exit points behind the pubic symphysis and into the retropubic space. This space was confirmed with the use of the hand in the vagina to feel the needle tip prior to aspirating and injecting. A Foley catheter was in place from the hysterectomy portion of the case.  The Foley balloon was used to help identify the bladder and to plan the sling placement in the mid urethral position. An Allis clamp was placed 1 cm below the urethral meatus; a second Allis clamp was placed 2 cm below that. 10 cc of the injecting solution were placed between the 2 Allis clamps with some lateral spread. A #15 blade was used to incise between the Allis clamps and Mission scissors were used to dissect the vaginal mucosa laterally. Allis clamps were placed on the vaginal edges to help accomplish this dissection. The dissection was taken to just below the level of the pubic symphysis. Once the planned entry routes were dissected a rigid Foley was placed into the bladder. The rigid Foley was deviated towards the patient's right knee. With the anesthesiologist pointing out the patient's right shoulder so that I could aim to the midclavicular line, the trocar with trocar sleeve and sling attached was inserted into the pre-dissected space and was pushed through the pubovesical fascia then under the pubic symphysis. With dropping the trocar handle towards the ground and sharply rotating the  trocar to the abdominal wall I was able to guide the trocar out the abdominal wall at the planned exit point. A Roanna Reaves clamp was placed on the trocar sleeve the trocar was removed through the vagina. The sling tape was assessed and straightened and the trocar was placed in the opposite trocar sleeve. Again with retracting the dissected vaginal mucosa and placing the trocar in the planned left sided entry route, along with deviating the rigid Foley to the patient's left knee and aiming towards the left midclavicular line the trocar was passed under the pubic symphysis sharply rotated out of the abdominal wall and out the planned exit route. At this point cystoscopy was carried out. The bladder was filled to 300 cc and evaluation was done of the entire bladder mucosa and the urethra to ensure no sling material was eroding through the mucosa. Fresh blue urine was seen coming from the ureters. Using a Babcock clamp to protect a small knuckle of sling material in the midpoint of the sling just under the urethra, the sling was pulled out through the  abdominal entry points. Proper tensioning was assured with the use of a Babcock clamp and once I was assured a tension-free placement Jonessa Triplett clamps were placed on the plastic sheaths at the abdominal exit points and the abdominal sheaths were removed. At this point a Kary Kos was released and a tension-free placement was seen. Good hemostasis was noted, a repeat cystoscopy was done to confirm no sling material and the bladder or urethra. The vaginal mucosa was closed with 2-0 Vicryl on a CT-1.   After completion of the hysterectomy and the TVT sling the cystocele was markedly reduced but still enough proved to proceed with cystocele repair.  Allis clamp was placed in the midline vaginal mucosa about 2 cm proximal to the end of the incision from the TVT.  A second Allis was placed about 6 cm proximal to that.  The midline was marked with Bovie cautery.  Injection with local  anesthetic mixed with saline was done to hydrodissect the space.  An 11 blade was used to make an incision between these Allis clamps.  Additional Allis clamps were placed on the lateral edges of the vaginal mucosa and using Metzenbaum scissors the vaginal mucosa was serially dissected away from the underlying pubovesicle fascia.  Both blunt and sharp dissection was done with the help of retraction.  Once the cystocele was dissected away from the vaginal mucosal  Tissue, interrupted 0 Vicryl sutures were used to reapproximate the lateral pubovesical fascia over the cystocele in the midline.  The cystocele was appropriately reduced in this fashion.  Excess vaginal mucosal tissue was removed with the Metzenbaum scissors and the vaginal mucosa was repaired with a 2-0 Vicryl in a running fashion.  Throughout the entire cystocele and TVT portion of the case irrigation was carried out.  Good hemostasis was noted.  A 14 French Foley was placed at the end of the case.   Sponge lap and needle counts were correct x3 the patient was woken from general anesthesia having tolerated the procedure well and taken to the recovery room in a stable fashion.   Ala Dach 02/06/2020 7:42 AM

## 2020-02-06 NOTE — Anesthesia Procedure Notes (Signed)
Procedure Name: Intubation Date/Time: 02/06/2020 7:40 AM Performed by: Stacee Earp D, CRNA Pre-anesthesia Checklist: Patient identified, Emergency Drugs available, Suction available and Patient being monitored Patient Re-evaluated:Patient Re-evaluated prior to induction Oxygen Delivery Method: Circle system utilized Preoxygenation: Pre-oxygenation with 100% oxygen Induction Type: IV induction Ventilation: Mask ventilation without difficulty Laryngoscope Size: Mac and 4 Grade View: Grade I Tube type: Oral Tube size: 7.0 mm Number of attempts: 1 Airway Equipment and Method: Stylet Placement Confirmation: ETT inserted through vocal cords under direct vision,  positive ETCO2 and breath sounds checked- equal and bilateral Secured at: 21 cm Tube secured with: Tape Dental Injury: Teeth and Oropharynx as per pre-operative assessment

## 2020-02-06 NOTE — Anesthesia Postprocedure Evaluation (Signed)
Anesthesia Post Note  Patient: Christina Rivera  Procedure(s) Performed: XI ROBOTIC ASSISTED TOTAL HYSTERECTOMY WITH BILATERAL SALPINGO OOPHORECTOMY (Bilateral ) TRANSVAGINAL TAPE (TVT) PROCEDURE (N/A ) CYSTOSCOPY (N/A ) ANTERIOR REPAIR (CYSTOCELE) (N/A )     Patient location during evaluation: PACU Anesthesia Type: General Level of consciousness: awake and alert Pain management: pain level controlled Vital Signs Assessment: post-procedure vital signs reviewed and stable Respiratory status: spontaneous breathing, nonlabored ventilation, respiratory function stable and patient connected to nasal cannula oxygen Cardiovascular status: blood pressure returned to baseline and stable Postop Assessment: no apparent nausea or vomiting Anesthetic complications: no   No complications documented.  Last Vitals:  Vitals:   02/06/20 1145 02/06/20 1200  BP: 140/73 122/69  Pulse: 63 (!) 58  Resp: 17 14  Temp:    SpO2: 98% 96%    Last Pain:  Vitals:   02/06/20 1201  TempSrc:   PainSc: 6                  Carmel Waddington S

## 2020-02-06 NOTE — H&P (Signed)
Chief complaint: Endometrial hyperplasia, cystocele, history of breast cancer, stress urinary incontinence  History of present illness: 64 year old G4 P2-0-2-2 with history of breast cancer, on anastrozole, with worsening symptoms of cystocele.  Patient with history of stress incontinence, slightly improved with worsening cystocele.  Patient has been using a pessary for several years which worsens her stress incontinence.  Over time patient has decided pessary is not a good long-term option for management of her cystocele.  Pessary impairs her ability to be sexually active and patient is unable to self remove her pessary.  Patient has worked with pelvic floor physical therapy and has maxed out those therapies.  Patient also with history of endometrial hyperplasia though recent biopsies have been clean and ultrasounds with thin endometrial stripe.  Patient desires definitive surgical management especially as she remains on long-term treatment for her breast cancer.  Ultrasound September 2021: Uterus 5 x 3 x 3, endometrial stripe 2.7 mm, normal bilateral ovaries. Endometrial biopsy September 21: Essentially acellular specimen  Medications: Anastrozole, Finacea, Nasacort, vitamins, Allegra  Allergies: Neomycin, tramadol, doxycycline  Past Medical History:  Diagnosis Date  . Anxiety   . Arthritis   . Cancer ([place]) 11/2015   left breast  . Complication of anesthesia   . Hot flashes   . Hx of degenerative disc disease   . Multiple thyroid nodules    being followed, no problems  . PONV (postoperative nausea and vomiting)   . Snores   . Vertigo     Past Surgical History:  Procedure Laterality Date  . bilateral hammertoe repair    . BREAST IMPLANT EXCHANGE Bilateral 01/15/2017   Procedure: BREAST IMPLANT EXCHANGE;  Surgeon: Irene Limbo, MD;  Location: [place];  Service: Plastics;  Laterality: Bilateral;  . BREAST LUMPECTOMY WITH RADIOACTIVE SEED AND SENTINEL LYMPH NODE  BIOPSY Left 12/04/2015   Procedure: BREAST LUMPECTOMY WITH RADIOACTIVE SEED AND SENTINEL LYMPH NODE BIOPSY;  Surgeon: Stark Klein, MD;  Location: [place];  Service: General;  Laterality: Left;  BREAST LUMPECTOMY WITH RADIOACTIVE SEED AND SENTINEL LYMPH NODE BIOPSY  . CAPSULECTOMY Bilateral 01/15/2017   Procedure: COMPLETE CAPSULECTOMY;  Surgeon: Irene Limbo, MD;  Location: [place];  Service: Plastics;  Laterality: Bilateral;  . LUMBAR MICRODISCECTOMY    . PLACEMENT OF BREAST IMPLANTS    . TOTAL HIP ARTHROPLASTY Left 10/2019    Physical exam Vitals:   02/06/20 0532  BP: (!) 141/73  Pulse: 68  Resp: 18  Temp: 98.1 F (36.7 C)  SpO2: 100%   General: Well-appearing, no distress Abdomen: Soft, nontender, nondistended GU: Deferred OR Lower extremity: Nontender, no edema  CBC    Component Value Date/Time   WBC 6.3 01/25/2020 1134   RBC 4.44 01/25/2020 1134   HGB 12.9 01/25/2020 1134   HGB 14.2 11/20/2015 1225   HCT 40.3 01/25/2020 1134   HCT 43.5 11/20/2015 1225   PLT 258 01/25/2020 1134   PLT 291 11/20/2015 1225   MCV 90.8 01/25/2020 1134   MCV 88.0 11/20/2015 1225   MCH 29.1 01/25/2020 1134   MCHC 32.0 01/25/2020 1134   RDW 13.3 01/25/2020 1134   RDW 13.9 11/20/2015 1225   LYMPHSABS 2.7 11/20/2015 1225   MONOABS 0.5 11/20/2015 1225   EOSABS 0.0 11/20/2015 1225   BASOSABS 0.1 11/20/2015 1225    Assessment and plan: 64 year old G4 P2 with history of endometrial hyperplasia, history of breast cancer on SERM, stress incontinence and cystocele.  Plan to proceed with robotic assisted total  laparoscopic hysterectomy with bilateral salpingo-oophorectomy followed by TVT sling and cystocele repair.  Patient aware risk benefits of surgery including anesthesia risks, bleeding, infection, damage surrounding organs, failure to achieve desired goal.  Patient aware 25% chance she will have to go home with a catheter and other sling risks including  sling erosion and need to take back to the OR for loosening of sling.  Patient agrees to surgical risks.  Ala Dach 02/06/2020 7:28 AM

## 2020-02-06 NOTE — Transfer of Care (Signed)
Immediate Anesthesia Transfer of Care Note  Patient: Christina Rivera  Procedure(s) Performed: XI ROBOTIC ASSISTED TOTAL HYSTERECTOMY WITH BILATERAL SALPINGO OOPHORECTOMY (Bilateral ) TRANSVAGINAL TAPE (TVT) PROCEDURE (N/A ) CYSTOSCOPY (N/A ) ANTERIOR REPAIR (CYSTOCELE) (N/A )  Patient Location: PACU  Anesthesia Type:General  Level of Consciousness: awake, alert  and oriented  Airway & Oxygen Therapy: Patient Spontanous Breathing and Patient connected to nasal cannula oxygen  Post-op Assessment: Report given to RN and Post -op Vital signs reviewed and stable  Post vital signs: Reviewed and stable  Last Vitals:  Vitals Value Taken Time  BP 139/78 02/06/20 1136  Temp    Pulse 70 02/06/20 1141  Resp 12 02/06/20 1141  SpO2 98 % 02/06/20 1141  Vitals shown include unvalidated device data.  Last Pain:  Vitals:   02/06/20 0532  TempSrc: Oral  PainSc: 0-No pain         Complications: No complications documented.

## 2020-02-07 ENCOUNTER — Encounter (HOSPITAL_BASED_OUTPATIENT_CLINIC_OR_DEPARTMENT_OTHER): Payer: Self-pay | Admitting: Obstetrics

## 2020-02-07 DIAGNOSIS — Z853 Personal history of malignant neoplasm of breast: Secondary | ICD-10-CM | POA: Diagnosis not present

## 2020-02-07 DIAGNOSIS — Z90721 Acquired absence of ovaries, unilateral: Secondary | ICD-10-CM | POA: Diagnosis not present

## 2020-02-07 DIAGNOSIS — N393 Stress incontinence (female) (male): Secondary | ICD-10-CM | POA: Diagnosis not present

## 2020-02-07 DIAGNOSIS — Z90710 Acquired absence of both cervix and uterus: Secondary | ICD-10-CM | POA: Diagnosis not present

## 2020-02-07 DIAGNOSIS — N8110 Cystocele, unspecified: Secondary | ICD-10-CM | POA: Diagnosis not present

## 2020-02-07 DIAGNOSIS — N8500 Endometrial hyperplasia, unspecified: Secondary | ICD-10-CM | POA: Diagnosis not present

## 2020-02-07 LAB — CBC
HCT: 37 % (ref 36.0–46.0)
Hemoglobin: 12.2 g/dL (ref 12.0–15.0)
MCH: 29.2 pg (ref 26.0–34.0)
MCHC: 33 g/dL (ref 30.0–36.0)
MCV: 88.5 fL (ref 80.0–100.0)
Platelets: 231 10*3/uL (ref 150–400)
RBC: 4.18 MIL/uL (ref 3.87–5.11)
RDW: 13.2 % (ref 11.5–15.5)
WBC: 11.8 10*3/uL — ABNORMAL HIGH (ref 4.0–10.5)
nRBC: 0 % (ref 0.0–0.2)

## 2020-02-07 LAB — SURGICAL PATHOLOGY

## 2020-02-07 MED ORDER — OXYCODONE HCL 5 MG PO TABS
ORAL_TABLET | ORAL | Status: AC
  Filled 2020-02-07: qty 2

## 2020-02-07 MED ORDER — ACETAMINOPHEN 500 MG PO TABS: 1000.0000 mg | ORAL_TABLET | Freq: Four times a day (QID) | ORAL | 1 refills | Status: DC | PRN

## 2020-02-07 MED ORDER — KETOROLAC TROMETHAMINE 30 MG/ML IJ SOLN
INTRAMUSCULAR | Status: AC
  Filled 2020-02-07: qty 1

## 2020-02-07 MED ORDER — OXYCODONE HCL 5 MG PO TABS
5.0000 mg | ORAL_TABLET | Freq: Four times a day (QID) | ORAL | 0 refills | Status: AC | PRN
Start: 2020-02-07 — End: 2020-02-14

## 2020-02-07 MED ORDER — IBUPROFEN 800 MG PO TABS
800.0000 mg | ORAL_TABLET | Freq: Three times a day (TID) | ORAL | 1 refills | Status: DC | PRN
Start: 2020-02-07 — End: 2020-07-03

## 2020-02-07 NOTE — Discharge Summary (Signed)
Christina Rivera MRN: [id number] DOB/AGE: 64-Dec-1957 64 y.o.  Admit date: 02/06/2020 Discharge date: February 07, 2020  Admission Diagnoses: Endometrial hyperplasia [N85.00] S/P hysterectomy with oophorectomy [Z90.710, Z90.721]  Stress urinary incontinence, cystocele, history of breast cancer  Discharge Diagnoses: Endometrial hyperplasia [N85.00] S/P hysterectomy with oophorectomy [Z90.710, Z90.721]        Active Problems:   Endometrial hyperplasia   S/P hysterectomy with oophorectomy As above  Discharged Condition: stable  Hospital Course: Patient admitted for planned robotic assisted total laparoscopic hysterectomy with bilateral salpingo-oophorectomy with cystocele repair, TVT sling and cystoscopy.  See operative notes for details.  Overall uncomplicated surgery which achieve desired outcome.  Overnight patient did well.  Pain was controlled with oral narcotics.  Patient without nausea or vomiting.  Patient able to eat a regular breakfast this morning and feeling hungry.  Patient walked without dizziness.  Minimal flatus, no vaginal bleeding.  Foley catheter was kept in overnight.  This a.m. patient had backfill of her bladder to 300 cc and was able to void 350 cc.  On postop day 1, Vitals:   02/06/20 1800 02/06/20 2215 02/07/20 0223 02/07/20 0500  BP: (!) 152/77 (!) 118/58 119/64 (!) 131/59  Pulse: 66 74 65 67  Resp: 14 18 16 16   Temp: 98 F (36.7 C) 98.3 F (36.8 C) 98.4 F (36.9 C) 98.5 F (36.9 C)  TempSrc:      SpO2: 96% 96% 96% 97%  Weight:      Height:       General: Well-appearing, no distress Abdomen: Soft, appropriately tender, nondistended Skin: Incisions clean dry, covered in but Dermabond GU: No significant staining Lower extremities: Nontender, no edema  CBC    Component Value Date/Time   WBC 11.8 (H) 02/07/2020 0549   RBC 4.18 02/07/2020 0549   HGB 12.2 02/07/2020 0549   HGB 14.2 11/20/2015 1225   HCT 37.0 02/07/2020 0549   HCT 43.5 11/20/2015 1225    PLT 231 02/07/2020 0549   PLT 291 11/20/2015 1225   MCV 88.5 02/07/2020 0549   MCV 88.0 11/20/2015 1225   MCH 29.2 02/07/2020 0549   MCHC 33.0 02/07/2020 0549   RDW 13.2 02/07/2020 0549   RDW 13.9 11/20/2015 1225   LYMPHSABS 2.7 11/20/2015 1225   MONOABS 0.5 11/20/2015 1225   EOSABS 0.0 11/20/2015 1225   BASOSABS 0.1 11/20/2015 1225    Consults: None  Treatments: surgery: Robotic assisted total laparoscopic hysterectomy with bilateral salpingo-oophorectomy, cystocele repair, TVT sling  Disposition: Discharge disposition: 01-Home or Self Care        Allergies as of 02/07/2020      Reactions   Tramadol Other (See Comments)   Dizziness   Doxycycline Nausea And Vomiting   Dizziness   Neomycin Rash   Tape Rash   Band ai d flexible , flesh color and fabric on outside  rash      Medication List    TAKE these medications   acetaminophen 500 MG tablet Commonly known as: TYLENOL Take 2 tablets (1,000 mg total) by mouth every 6 (six) hours as needed.   anastrozole 1 MG tablet Commonly known as: ARIMIDEX TAKE 1 TABLET(1 MG) BY MOUTH DAILY What changed:   how much to take  how to take this  when to take this  additional instructions   calcium carbonate 1500 (600 Ca) MG Tabs tablet Commonly known as: OSCAL Take 600 mg of elemental calcium by mouth 2 (two) times daily with a meal.   carboxymethylcellulose 0.5 %  Soln Commonly known as: REFRESH PLUS 1 drop 2 (two) times daily as needed (dry eyes).   fexofenadine 180 MG tablet Commonly known as: ALLEGRA Take 180 mg by mouth daily.   Finacea 15 % cream Generic drug: Azelaic Acid Apply 1 application topically daily.   ibuprofen 800 MG tablet Commonly known as: ADVIL Take 1 tablet (800 mg total) by mouth every 8 (eight) hours as needed.   NASACORT ALLERGY 24HR NA Place 2 sprays into the nose daily.   oxyCODONE 5 MG immediate release tablet Commonly known as: Oxy IR/ROXICODONE Take 1-2 tablets (5-10 mg  total) by mouth every 6 (six) hours as needed for up to 7 days for moderate pain.   Turmeric 500 MG Caps Take 1 capsule by mouth daily. What changed: how much to take        Signed: Ala Dach, MD 02/07/2020, 8:21 AM

## 2020-02-07 NOTE — Discharge Instructions (Signed)
Urethral Vaginal Sling, Care After This sheet gives you information about how to care for yourself after your procedure. Your health care provider may also give you more specific instructions. If you have problems or questions, contact your health care provider. What can I expect after the procedure? After the procedure:  It is common to have some abdominal pain. Your health care provider will give you pain medicines for this.  You may also have a gauze packing in the vagina to prevent bleeding. This will be removed in 1-2 days. Follow these instructions at home: Incision care   Follow instructions from your health care provider about how to take care of your abdominal incision. Make sure you: ? Wash your hands with soap and water before you change your bandage (dressing). If soap and water are not available, use hand sanitizer. ? Change your dressing as told by your health care provider. ? Leave stitches (sutures), skin glue, or adhesive strips in place. These skin closures may need to stay in place for 2 weeks or longer. If adhesive strip edges start to loosen and curl up, you may trim the loose edges. Do not remove adhesive strips completely unless your health care provider tells you to do that.  Check your incision area every day for signs of infection. Check for: ? Redness, swelling, or pain. ? Fluid or blood. ? Warmth. ? Pus or a bad smell. Activity  Get plenty of rest.  Limit exercise and activities as told by your health care provider.  Do not lift anything that is heavier than 5 pounds (2.3 kg) until your health care provider says that it is safe.  Do not douche, use tampons, or have sexual intercourse for 6 weeks after your procedure or as told by your health care provider.  Do not drive or use heavy machinery while taking prescription pain medicine.  Return to your normal activities as told by your health care provider. Ask your health care provider what activities are  safe for you. General instructions  If you have a urinary catheter in place, follow instructions from your health care provider about how to empty the catheter bag.  Do not take baths, swim, or use a hot tub until your health care provider approves. Ask your health care provider if you may take showers. You may only be allowed to take sponge baths.  Take over-the-counter and prescription medicines only as told by your health care provider. Do not take aspirin because it can cause bleeding.  Do not use any products that contain nicotine or tobacco, such as cigarettes and e-cigarettes. These can delay bone healing. If you need help quitting, ask your health care provider.  You may resume your usual diet. Eat a well-balanced diet.  To prevent or treat constipation while you are taking prescription pain medicine, your health care provider may recommend that you: ? Drink enough fluid to keep your urine pale yellow. ? Take over-the-counter or prescription medicines. ? Eat foods that are high in fiber, such as fresh fruits and vegetables, whole grains, and beans. ? Limit foods that are high in fat and processed sugars, such as fried and sweet foods.  Avoid straining when having a bowel movement.  Keep all follow-up visits as told by your health care provider. This is important. Contact a health care provider if:  You have a heavy or bad smelling vaginal discharge.  You have bruising in the vaginal area.  You have pain that is not controlled with medicines.  Your incision feels warm to the touch.  You have redness, swelling, or pain around your incision.  You have pus or a bad smell coming from your incision.  You have fluid or blood coming from your incision.  You feel faint or light-headed.  You have a rash. Get help right away if:  You have a fever.  You faint.  You have shortness of breath.  You have chest, abdominal, or leg pain.  You have pain when urinating or cannot  urinate.  Your catheter is still in your bladder and it becomes blocked.  You have vaginal bleeding.  You have swelling, redness, and pain in the vaginal area. Summary  After the procedure, it is common to have some abdominal pain. Your health care provider will give you pain medicines for this.  Follow instructions from your health care provider about how to take care of your incision.  Limit exercise and activities as told by your health care provider.  Contact your health care provider if you have any signs of infection after your surgery. This information is not intended to replace advice given to you by your health care provider. Make sure you discuss any questions you have with your health care provider. Document Revised: 03/19/2017 Document Reviewed: 07/15/2016 Elsevier Patient Education  2020 [place]. Laparoscopically Assisted Vaginal Hysterectomy, Care After This sheet gives you information about how to care for yourself after your procedure. Your health care provider may also give you more specific instructions. If you have problems or questions, contact your health care provider. What can I expect after the procedure? After the procedure, it is common to have:  Soreness and numbness in your incision areas.  Abdominal pain. You will be given pain medicine to control it.  Vaginal bleeding and discharge. You will need to use a sanitary napkin after this procedure.  Sore throat from the breathing tube that was inserted during surgery. Follow these instructions at home: Medicines  Take over-the-counter and prescription medicines only as told by your health care provider.  Do not take aspirin or ibuprofen. These medicines can cause bleeding.  Do not drive or use heavy machinery while taking prescription pain medicine.  Do not drive for 24 hours if you were given a medicine to help you relax (sedative) during the procedure. Incision care   Follow instructions  from your health care provider about how to take care of your incisions. Make sure you: ? Wash your hands with soap and water before you change your bandage (dressing). If soap and water are not available, use hand sanitizer. ? Change your dressing as told by your health care provider. ? Leave stitches (sutures), skin glue, or adhesive strips in place. These skin closures may need to stay in place for 2 weeks or longer. If adhesive strip edges start to loosen and curl up, you may trim the loose edges. Do not remove adhesive strips completely unless your health care provider tells you to do that.  Check your incision area every day for signs of infection. Check for: ? Redness, swelling, or pain. ? Fluid or blood. ? Warmth. ? Pus or a bad smell. Activity  Get regular exercise as told by your health care provider. You may be told to take short walks every day and go farther each time.  Return to your normal activities as told by your health care provider. Ask your health care provider what activities are safe for you.  Do not douche, use tampons, or have  sexual intercourse for at least 6 weeks, or until your health care provider gives you permission.  Do not lift anything that is heavier than 10 lb (4.5 kg), or the limit that your health care provider tells you, until he or she says that it is safe. General instructions  Do not take baths, swim, or use a hot tub until your health care provider approves. Take showers instead of baths.  Do not drive for 24 hours if you received a sedative.  Do not drive or operate heavy machinery while taking prescription pain medicine.  To prevent or treat constipation while you are taking prescription pain medicine, your health care provider may recommend that you: ? Drink enough fluid to keep your urine clear or pale yellow. ? Take over-the-counter or prescription medicines. ? Eat foods that are high in fiber, such as fresh fruits and vegetables, whole  grains, and beans. ? Limit foods that are high in fat and processed sugars, such as fried and sweet foods.  Keep all follow-up visits as told by your health care provider. This is important. Contact a health care provider if:  You have signs of infection, such as: ? Redness, swelling, or pain around your incision sites. ? Fluid or blood coming from an incision. ? An incision that feels warm to the touch. ? Pus or a bad smell coming from an incision.  Your incision breaks open.  Your pain medicine is not helping.  You feel dizzy or light-headed.  You have pain or bleeding when you urinate.  You have persistent nausea and vomiting.  You have blood, pus, or a bad-smelling discharge from your vagina. Get help right away if:  You have a fever.  You have severe abdominal pain.  You have chest pain.  You have shortness of breath.  You faint.  You have pain, swelling, or redness in your leg.  You have heavy bleeding from your vagina. Summary  After the procedure, it is common to have abdominal pain and vaginal bleeding.  You should not drive or lift heavy objects until your health care provider says that it is safe.  Contact your health care provider if you have any symptoms of infection, excessive vaginal bleeding, nausea, vomiting, or shortness of breath. This information is not intended to replace advice given to you by your health care provider. Make sure you discuss any questions you have with your health care provider. Document Revised: 03/19/2017 Document Reviewed: 06/02/2016 Elsevier Patient Education  2020 [employer].

## 2020-02-19 DIAGNOSIS — R32 Unspecified urinary incontinence: Secondary | ICD-10-CM | POA: Diagnosis not present

## 2020-02-19 DIAGNOSIS — Z23 Encounter for immunization: Secondary | ICD-10-CM | POA: Diagnosis not present

## 2020-05-08 DIAGNOSIS — M1611 Unilateral primary osteoarthritis, right hip: Secondary | ICD-10-CM | POA: Diagnosis not present

## 2020-05-08 DIAGNOSIS — Z96642 Presence of left artificial hip joint: Secondary | ICD-10-CM | POA: Diagnosis not present

## 2020-05-21 DIAGNOSIS — Z79899 Other long term (current) drug therapy: Secondary | ICD-10-CM | POA: Diagnosis not present

## 2020-05-21 DIAGNOSIS — Z0000 Encounter for general adult medical examination without abnormal findings: Secondary | ICD-10-CM | POA: Diagnosis not present

## 2020-05-21 DIAGNOSIS — C50212 Malignant neoplasm of upper-inner quadrant of left female breast: Secondary | ICD-10-CM | POA: Diagnosis not present

## 2020-05-21 DIAGNOSIS — M064 Inflammatory polyarthropathy: Secondary | ICD-10-CM | POA: Diagnosis not present

## 2020-05-24 DIAGNOSIS — Z0000 Encounter for general adult medical examination without abnormal findings: Secondary | ICD-10-CM | POA: Diagnosis not present

## 2020-05-24 DIAGNOSIS — M1990 Unspecified osteoarthritis, unspecified site: Secondary | ICD-10-CM | POA: Diagnosis not present

## 2020-05-24 DIAGNOSIS — C50212 Malignant neoplasm of upper-inner quadrant of left female breast: Secondary | ICD-10-CM | POA: Diagnosis not present

## 2020-05-24 DIAGNOSIS — J309 Allergic rhinitis, unspecified: Secondary | ICD-10-CM | POA: Diagnosis not present

## 2020-05-27 ENCOUNTER — Other Ambulatory Visit: Payer: Self-pay | Admitting: Internal Medicine

## 2020-05-27 DIAGNOSIS — Z0000 Encounter for general adult medical examination without abnormal findings: Secondary | ICD-10-CM

## 2020-06-04 IMAGING — CT CT CHEST W/O CM
1 series · 15 of 34 positions shown, 19 images · non-contrast
Comparison: Chest x-ray 01/06/2018

CLINICAL DATA: 61-year-old female with a history of left-sided
breast cancer status post lumpectomy, axillary nodal dissection and
radiation therapy. Follow-up lung nodule.

EXAM:
CT CHEST WITHOUT CONTRAST
TECHNIQUE: Multidetector CT imaging of the chest was performed following the
standard protocol without IV contrast.

[Series 2: chest w/(date) · axial · 0.71mm/px · z∈[-289,-9]mm · 15 of 166 slices shown, 19 images]
[im 13/166  mediastinal]
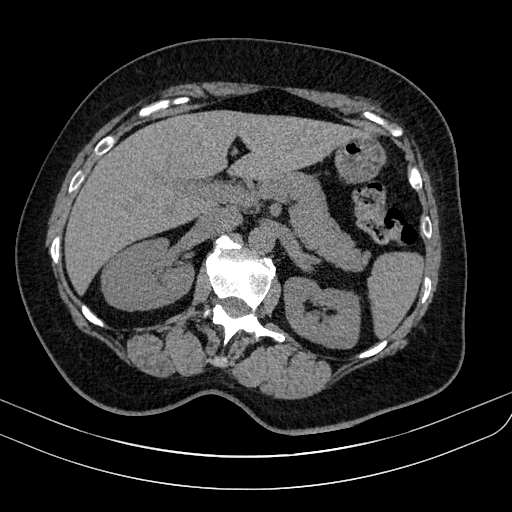
[im 13/166  lung]
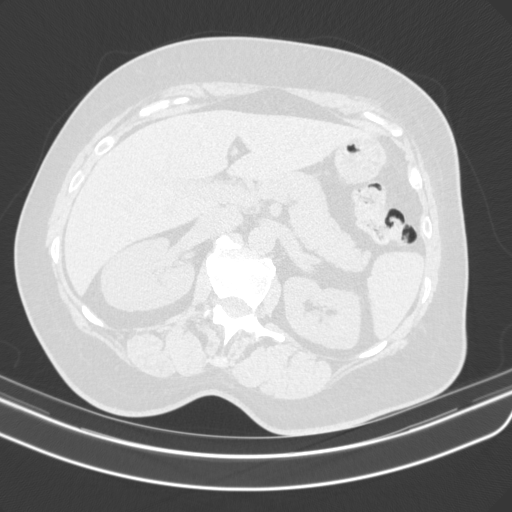
[im 25/166  lung]
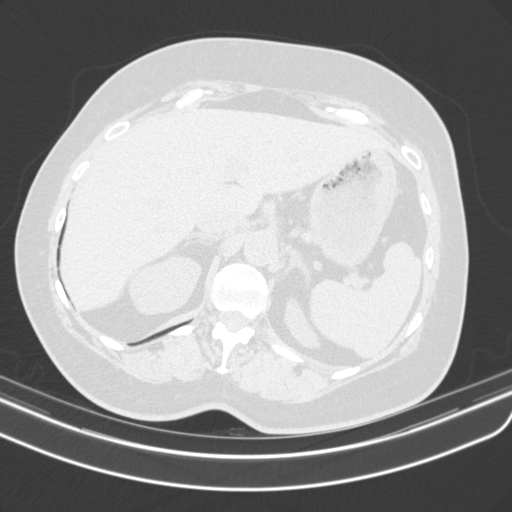
[im 34/166  lung]
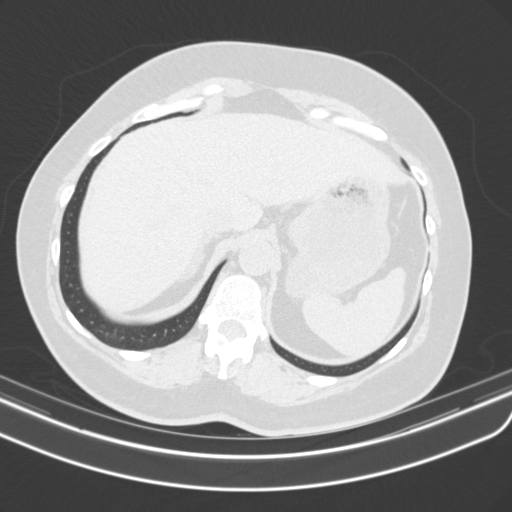
[im 43/166  lung]
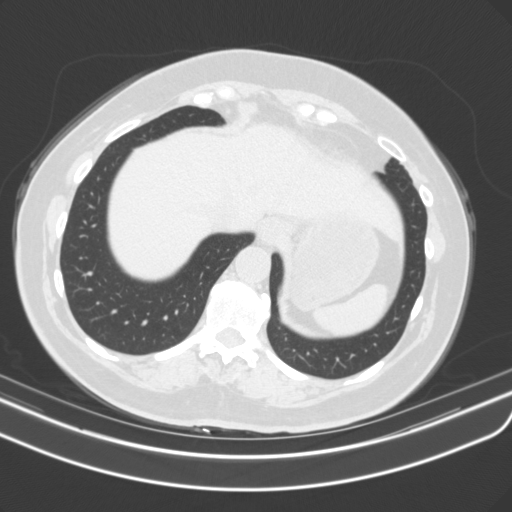
[im 56/166  mediastinal]
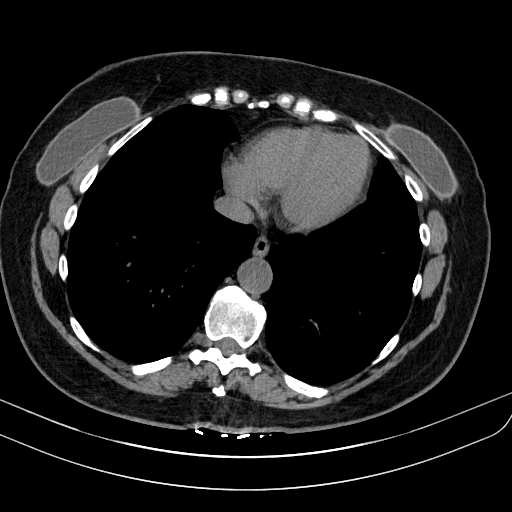
[im 56/166  lung]
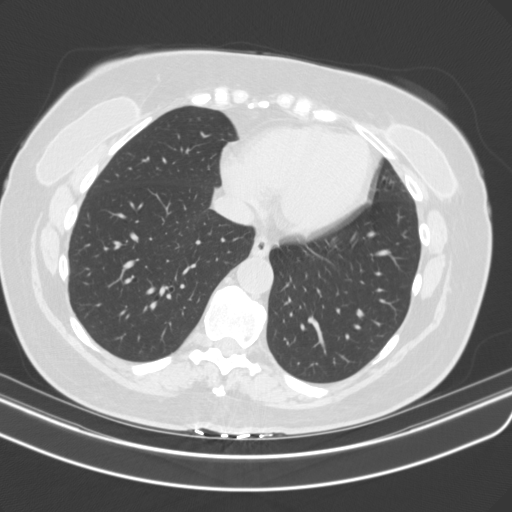
[im 67/166  lung]
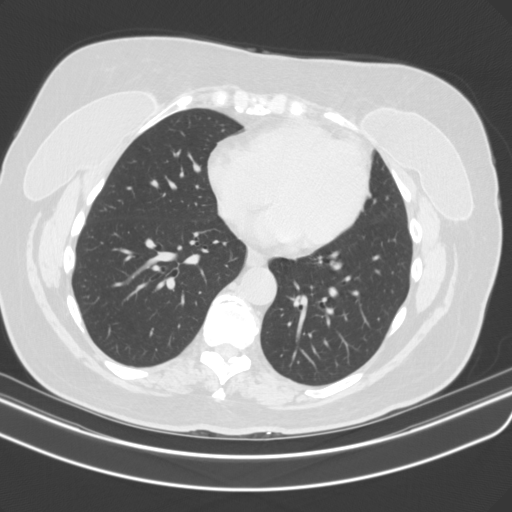
[im 74/166  lung]
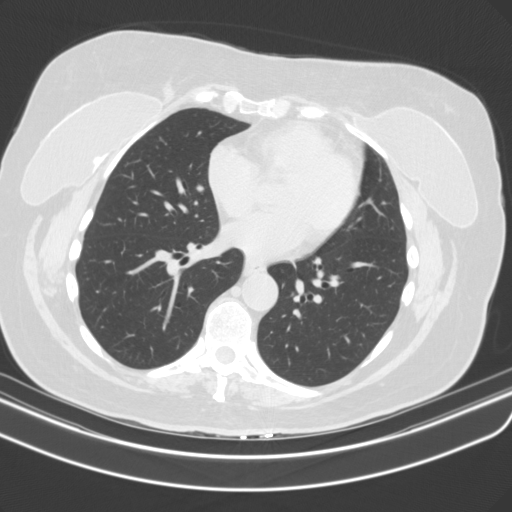
[im 86/166  lung]
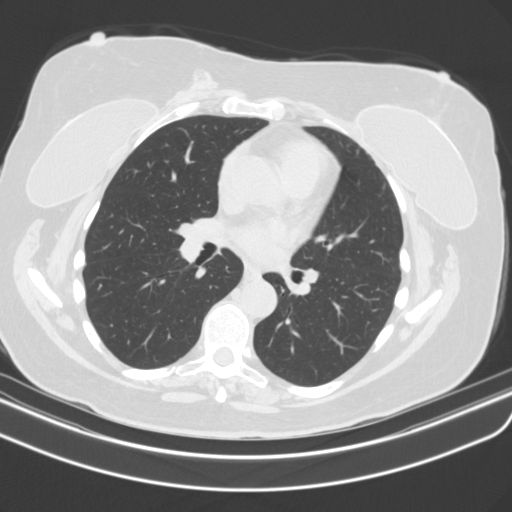
[im 92/166  mediastinal]
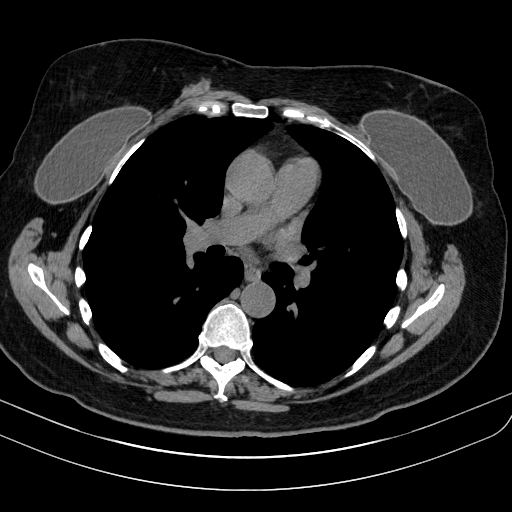
[im 92/166  lung]
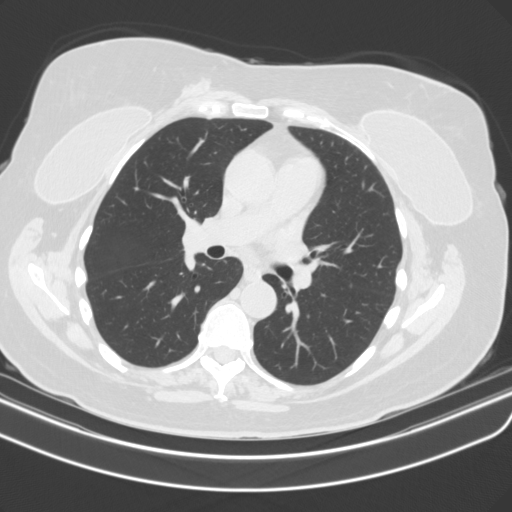
[im 100/166  lung]
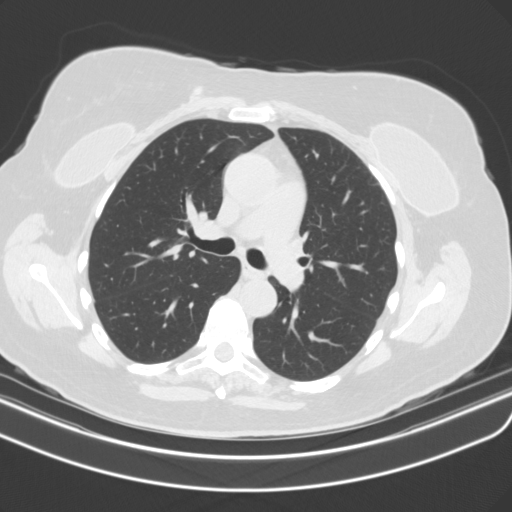
[im 111/166  lung]
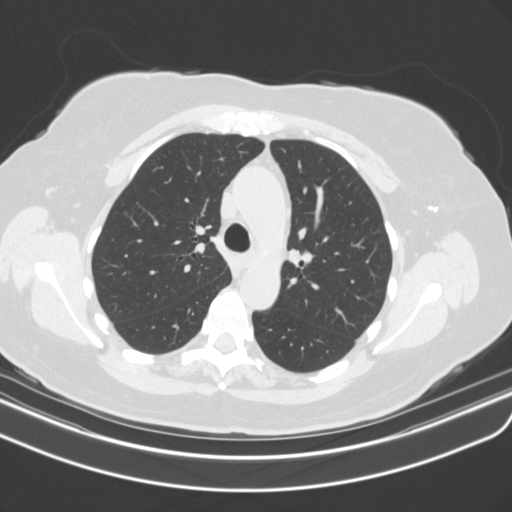
[im 123/166  lung]
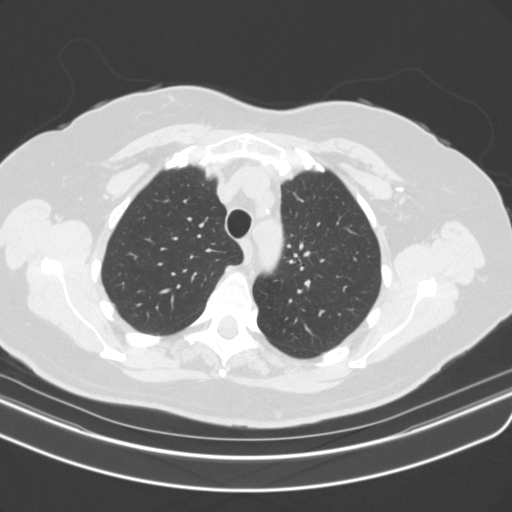
[im 133/166  mediastinal]
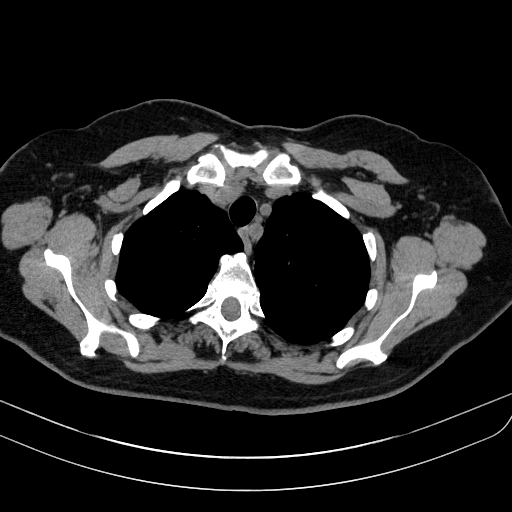
[im 133/166  lung]
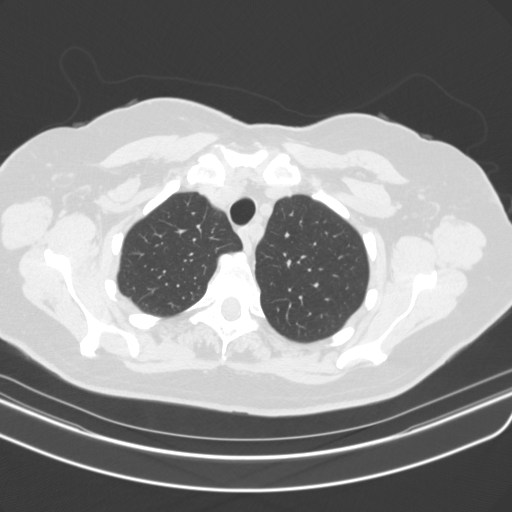
[im 141/166  lung]
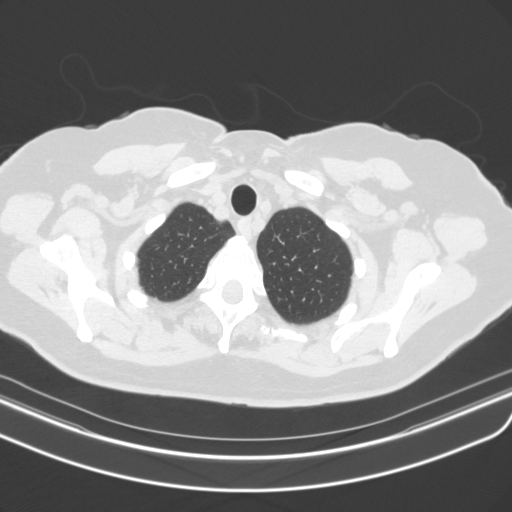
[im 153/166  lung]
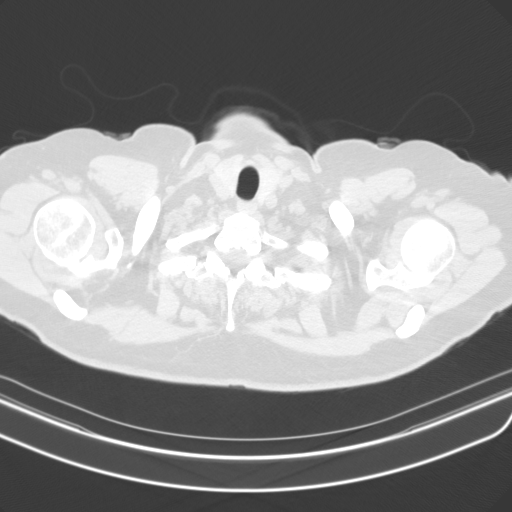

[15 of 34 positions shown; findings below may reference images not displayed]

FINDINGS: Cardiovascular: Limited evaluation in the absence of intravenous
contrast. 4 vessel aortic arch anatomy. The left vertebral artery
arises directly from the aorta. The aorta is normal in caliber as is
the main pulmonary artery. The heart is normal in size. No
pericardial effusion. No significant atherosclerotic plaque in the
aorta.

Mediastinum/Nodes: Unremarkable CT appearance of the thyroid gland.
No suspicious mediastinal or hilar adenopathy. No soft tissue
mediastinal mass. The thoracic esophagus is unremarkable.

Lungs/Pleura: Minimal biapical pleuroparenchymal scarring. The lungs
are otherwise clear. No suspicious pulmonary mass or nodule. No
emphysematous or bronchitic changes.

Upper Abdomen: Visualized upper abdominal organs are unremarkable.

Musculoskeletal: No acute fracture or aggressive appearing lytic or
blastic osseous lesion. Bilateral breast augmentation prostheses.
Surgical clips in the left axilla consistent with prior axillary
nodal dissection. No suspicious lymphadenopathy. Asymmetric breast
tissue present in the medial aspect of the right breast.
IMPRESSION: 1. No acute or clinically significant abnormality. Specifically, no
left upper lobe pulmonary nodules are identified.
2. Minimal biapical pleuroparenchymal scarring noted incidentally.

## 2020-06-10 ENCOUNTER — Ambulatory Visit
Admission: RE | Admit: 2020-06-10 | Discharge: 2020-06-10 | Disposition: A | Discharge status: Home / Self Care | Payer: No Typology Code available for payment source | Source: Ambulatory Visit | Attending: Internal Medicine | Admitting: Internal Medicine

## 2020-06-10 DIAGNOSIS — Z0000 Encounter for general adult medical examination without abnormal findings: Secondary | ICD-10-CM

## 2020-06-24 ENCOUNTER — Other Ambulatory Visit: Payer: Self-pay | Admitting: Hematology and Oncology

## 2020-07-02 NOTE — Progress Notes (Signed)
Patient Care Team: Deland Pretty, MD as PCP - General (Internal Medicine) Stark Klein, MD as Consulting Physician (General Surgery) Nicholas Lose, MD as Consulting Physician (Hematology and Oncology) Delice Bison, Charlestine Massed, NP as Nurse Practitioner (Hematology and Oncology) Gery Pray, MD as Consulting Physician (Radiation Oncology)  DIAGNOSIS:    ICD-10-CM   1. Malignant neoplasm of upper-inner quadrant of left breast in female, estrogen receptor positive ([place])  C50.212    Z17.0     SUMMARY OF ONCOLOGIC HISTORY: Oncology History  Breast cancer of upper-inner quadrant of left female breast ([place])  11/13/2015 Initial Diagnosis   Left breast biopsy: Invasive ductal carcinoma with ADH, grade 1, ER 90%, PR 90%, Ki-67 3%, HER-2 negative ratio 1.35 copy #2; left breast spiculated mass 8 mm at 9:00 position, axilla negative, T1 BN 0 stage IA clinical stage   12/04/2015 Surgery   Left lumpectomy [facility]): IDC with DCIS, 0.8 cm, margins negative, 0/5 lymph nodes negative, ER 90%, PR 90%, HER-2 negative, Ki-67 3%, T1 cN0 stage IA   12/04/2015 Oncotype testing   10/7%, low risk   01/07/2016 - 02/20/2016 Radiation Therapy   Adjuvant XRT ([place]): 1. The Left breast was treated to 50.4 Gy in 28 fractions at 1.8 Gy per fraction. 2. The Left breast was boosted to 10 Gy in 5 fractions at 2 Gy per fraction.    03/10/2016 -  Anti-estrogen oral therapy   Anastrozole 1 mg by mouth daily     CHIEF COMPLIANT: Follow-up of left breast cancer on anastrozole therapy  INTERVAL HISTORY: Christina Rivera is a 65 y.o. with above-mentioned history of left breast cancer treated with lumpectomy, radiation, and who is currently on antiestrogen therapy with anastrozole. She presents to the clinic today for annual follow-up. She had a hip replacement surgery.  ALLERGIES:  is allergic to tramadol, doxycycline, neomycin, and tape.  MEDICATIONS:  Current Outpatient Medications  Medication Sig Dispense  Refill   acetaminophen (TYLENOL) 500 MG tablet Take 2 tablets (1,000 mg total) by mouth every 6 (six) hours as needed. 60 tablet 1   anastrozole (ARIMIDEX) 1 MG tablet TAKE 1 TABLET(1 MG) BY MOUTH DAILY 90 tablet 3   calcium carbonate (OSCAL) 1500 (600 Ca) MG TABS tablet Take 600 mg of elemental calcium by mouth 2 (two) times daily with a meal.      carboxymethylcellulose (REFRESH PLUS) 0.5 % SOLN 1 drop 2 (two) times daily as needed (dry eyes).     fexofenadine (ALLEGRA) 180 MG tablet Take 180 mg by mouth daily.     FINACEA 15 % cream Apply 1 application topically daily.      ibuprofen (ADVIL) 800 MG tablet Take 1 tablet (800 mg total) by mouth every 8 (eight) hours as needed. 60 tablet 1   Triamcinolone Acetonide (NASACORT ALLERGY 24HR NA) Place 2 sprays into the nose daily.      Turmeric 500 MG CAPS Take 1 capsule by mouth daily. (Patient taking differently: Take 500 mg by mouth daily. )     No current facility-administered medications for this visit.    PHYSICAL EXAMINATION: ECOG PERFORMANCE STATUS: 1 - Symptomatic but completely ambulatory  Vitals:   07/03/20 1152  BP: 137/62  Pulse: 71  Resp: 18  Temp: 98.1 F (36.7 C)  SpO2: 100%   Filed Weights   07/03/20 1152  Weight: 200 lb 14.4 oz (91.1 kg)    BREAST: No palpable masses or nodules in either right or left breasts. No palpable axillary supraclavicular or infraclavicular  adenopathy no breast tenderness or nipple discharge. (exam performed in the presence of a chaperone)  LABORATORY DATA:  I have reviewed the data as listed CMP Latest Ref Rng & Units 01/25/2020 11/20/2015 09/29/2006  Glucose 70 - 99 mg/dL 98 127 88  BUN 8 - 23 mg/dL 18 17.3 9  Creatinine 0.44 - 1.00 mg/dL 0.61 0.8 0.67  Sodium 135 - 145 mmol/L 139 141 134(L)  Potassium 3.5 - 5.1 mmol/L 3.9 3.8 4.5  Chloride 98 - 111 mmol/L 104 - 102  CO2 22 - 32 mmol/L _0 Calcium 8.9 - 10.3 mg/dL 9.1 9.5 9.4  Total Protein 6.4 - 8.3 g/dL - 7.7 -  Total  Bilirubin 0.20 - 1.20 mg/dL - 0.37 -  Alkaline Phos 40 - 150 U/L - 55 -  AST 5 - 34 U/L - 18 -  ALT 0 - 55 U/L - 24 -    Lab Results  Component Value Date   WBC 11.8 (H) 02/07/2020   HGB 12.2 02/07/2020   HCT 37.0 02/07/2020   MCV 88.5 02/07/2020   PLT 231 02/07/2020   NEUTROABS 5.8 11/20/2015    ASSESSMENT & PLAN:  Breast cancer of upper-inner quadrant of left female breast ([place]) Left lumpectomy 12/04/2015: IDC with DCIS, 0.8 cm, margins negative, 0/5 lymph nodes negative, ER 90%, PR 90%, HER-2 negative, Ki-67 3%, T1 cN0 stage IA Oncotype Dx 10 (7% ROR) Adj XRT 01/07/16 to 02/20/16  Treatment Plan: Adjuvant antiestrogen therapy with anastrozole 1 mg daily 5 yearsstarted 03/10/2016 01/15/2017: Removal of bilateral silicone implants due to capsular rupture, bilateral capsulectomies, saline augmentation bilaterally  Anastrozole Toxicities:  Occasional muscle cramps: Turmeric has helped this. Denies any hot flashes  Osteoarthritis: Patient has seen orthopedic specialist.  She thinks she might need hip replacement surgery.  Breast cancer surveillance: 1. Breast exam 07/04/2119: Benign 2. Mammogram: 11/2019 at [facility]: Benign  She has a property in [place] and is looking to transition to [place] for the future in the next 1 to 2 years. We discussed with her about duration of antiestrogen therapy.  I discussed the data for 7 years of treatment versus 5 years.  RTC in 1 year    No orders of the defined types were placed in this encounter.  The patient has a good understanding of the overall plan. she agrees with it. she will call with any problems that may develop before the next visit here.  Total time spent: 20 mins including face to face time and time spent for planning, charting and coordination of care  Rulon Eisenmenger, MD, MPH 07/03/2020  I, Cloyde Reams Dorshimer, am acting as scribe for Dr. Nicholas Lose.  I have reviewed the above documentation for accuracy and  completeness, and I agree with the above.

## 2020-07-02 NOTE — Assessment & Plan Note (Signed)
Left lumpectomy 12/04/2015: IDC with DCIS, 0.8 cm, margins negative, 0/5 lymph nodes negative, ER 90%, PR 90%, HER-2 negative, Ki-67 3%, T1 cN0 stage IA Oncotype Dx 10 (7% ROR) Adj XRT 01/07/16 to 02/20/16  Treatment Plan: Adjuvant antiestrogen therapy with anastrozole 1 mg daily 5 yearsstarted 03/10/2016 01/15/2017: Removal of bilateral silicone implants due to capsular rupture, bilateral capsulectomies, saline augmentation bilaterally  Anastrozole Toxicities:  Occasional muscle cramps: Turmeric has helped this. Denies any hot flashes  Osteoarthritis: Patient has seen orthopedic specialist.  She thinks she might need hip replacement surgery.  Breast cancer surveillance: 1. Breast exam 07/04/2119: Benign 2. Mamm[facility]: 11/2019 at [facility]: Benign  She has a property in [place] and is looking to transition to [place] for the future in the next 1 to 2 years. We discussed with her about duration of antiestrogen therapy.  I discussed the data for 7 years of treatment versus 5 years.  RTC in 1 year

## 2020-07-03 ENCOUNTER — Other Ambulatory Visit: Payer: Self-pay

## 2020-07-03 ENCOUNTER — Ambulatory Visit: Payer: BC Managed Care – PPO | Admitting: Hematology and Oncology

## 2020-07-03 ENCOUNTER — Inpatient Hospital Stay: Payer: BC Managed Care – PPO | Attending: Hematology and Oncology | Admitting: Hematology and Oncology

## 2020-07-03 DIAGNOSIS — C50212 Malignant neoplasm of upper-inner quadrant of left female breast: Secondary | ICD-10-CM | POA: Diagnosis not present

## 2020-07-03 DIAGNOSIS — Z170 Estrogen receptor positive status [ER+]: Secondary | ICD-10-CM

## 2020-07-03 DIAGNOSIS — Z79811 Long term (current) use of aromatase inhibitors: Secondary | ICD-10-CM | POA: Insufficient documentation

## 2020-07-03 DIAGNOSIS — Z923 Personal history of irradiation: Secondary | ICD-10-CM | POA: Diagnosis not present

## 2020-08-08 DIAGNOSIS — Z85828 Personal history of other malignant neoplasm of skin: Secondary | ICD-10-CM | POA: Diagnosis not present

## 2020-08-08 DIAGNOSIS — L718 Other rosacea: Secondary | ICD-10-CM | POA: Diagnosis not present

## 2020-08-08 DIAGNOSIS — L821 Other seborrheic keratosis: Secondary | ICD-10-CM | POA: Diagnosis not present

## 2020-08-29 DIAGNOSIS — L2389 Allergic contact dermatitis due to other agents: Secondary | ICD-10-CM | POA: Diagnosis not present

## 2020-11-06 DIAGNOSIS — Z96642 Presence of left artificial hip joint: Secondary | ICD-10-CM | POA: Diagnosis not present

## 2020-11-06 DIAGNOSIS — M1611 Unilateral primary osteoarthritis, right hip: Secondary | ICD-10-CM | POA: Diagnosis not present

## 2020-12-09 DIAGNOSIS — R928 Other abnormal and inconclusive findings on diagnostic imaging of breast: Secondary | ICD-10-CM | POA: Diagnosis not present

## 2020-12-27 DIAGNOSIS — Z01419 Encounter for gynecological examination (general) (routine) without abnormal findings: Secondary | ICD-10-CM | POA: Diagnosis not present

## 2020-12-27 DIAGNOSIS — Z6829 Body mass index (BMI) 29.0-29.9, adult: Secondary | ICD-10-CM | POA: Diagnosis not present

## 2021-07-01 ENCOUNTER — Other Ambulatory Visit: Payer: Self-pay | Admitting: Hematology and Oncology

## 2021-07-01 NOTE — Progress Notes (Signed)
? ?Patient Care Team: ?Deland Pretty, MD as PCP - General (Internal Medicine) ?Stark Klein, MD as Consulting Physician (General Surgery) ?Nicholas Lose, MD as Consulting Physician (Hematology and Oncology) ?Gardenia Phlegm, NP as Nurse Practitioner (Hematology and Oncology) ?Gery Pray, MD as Consulting Physician (Radiation Oncology) ? ?DIAGNOSIS:  ?Encounter Diagnosis  ?Name Primary?  ? Malignant neoplasm of upper-inner quadrant of left breast in female, estrogen receptor positive ([place])   ? ? ?SUMMARY OF ONCOLOGIC HISTORY: ?Oncology History  ?Breast cancer of upper-inner quadrant of left female breast ([place])  ?11/13/2015 Initial Diagnosis  ? Left breast biopsy: Invasive ductal carcinoma with ADH, grade 1, ER 90%, PR 90%, Ki-67 3%, HER-2 negative ratio 1.35 copy #2; left breast spiculated mass 8 mm at 9:00 position, axilla negative, T1 BN 0 stage IA clinical stage ?  ?12/04/2015 Surgery  ? Left lumpectomy [facility]): IDC with DCIS, 0.8 cm, margins negative, 0/5 lymph nodes negative, ER 90%, PR 90%, HER-2 negative, Ki-67 3%, T1 cN0 stage IA ?  ?12/04/2015 Oncotype testing  ? 10/7%, low risk ?  ?01/07/2016 - 02/20/2016 Radiation Therapy  ? Adjuvant XRT ([facility]): 1. The Left breast was treated to 50.4 Gy in 28 fractions at 1.8 Gy per fraction. ?2. The Left breast was boosted to 10 Gy in 5 fractions at 2 Gy per fraction.  ?  ?03/10/2016 -  Anti-estrogen oral therapy  ? Anastrozole 1 mg by mouth daily ?  ? ? ?CHIEF COMPLIANT: Follow-up of left breast cancer on anastrozole therapy ? ?INTERVAL HISTORY: Christina Rivera is a  66 y.o. with above-mentioned history of left breast cancer treated with lumpectomy, radiation, and who is currently on antiestrogen therapy with anastrozole. She presents to the clinic today for annual follow-up.  She is tolerating anastrozole extremely well without any problems or concerns.  Denies any lumps or nodules in the breast. ? ?ALLERGIES:  is allergic to tramadol, doxycycline,  neomycin, and tape. ? ?MEDICATIONS:  ?Current Outpatient Medications  ?Medication Sig Dispense Refill  ? anastrozole (ARIMIDEX) 1 MG tablet TAKE 1 TABLET(1 MG) BY MOUTH DAILY 90 tablet 3  ? calcium carbonate (OSCAL) 1500 (600 Ca) MG TABS tablet Take 600 mg of elemental calcium by mouth 2 (two) times daily with a meal.     ? carboxymethylcellulose (REFRESH PLUS) 0.5 % SOLN 1 drop 2 (two) times daily as needed (dry eyes).    ? fexofenadine (ALLEGRA) 180 MG tablet Take 180 mg by mouth daily.    ? FINACEA 15 % cream Apply 1 application topically daily.     ? Triamcinolone Acetonide (NASACORT ALLERGY 24HR NA) Place 2 sprays into the nose daily.     ? Turmeric 500 MG CAPS Take 1 capsule by mouth daily. (Patient taking differently: Take 500 mg by mouth daily. )    ? ?No current facility-administered medications for this visit.  ? ? ?PHYSICAL EXAMINATION: ?ECOG PERFORMANCE STATUS: 1 - Symptomatic but completely ambulatory ? ?Vitals:  ? 07/03/21 1119  ?BP: (!) 143/70  ?Pulse: 76  ?Resp: 18  ?Temp: 97.9 ?F (36.6 ?C)  ?SpO2: 98%  ? ?Filed Weights  ? 07/03/21 1119  ?Weight: 204 lb 11.2 oz (92.9 kg)  ? ? ?BREAST: No palpable masses or nodules in either right or left breasts. No palpable axillary supraclavicular or infraclavicular adenopathy no breast tenderness or nipple discharge. (exam performed in the presence of a chaperone) ? ?LABORATORY DATA:  ?I have reviewed the data as listed ?CMP Latest Ref Rng & Units 01/25/2020 11/20/2015 09/29/2006  ?  Glucose 70 - 99 mg/dL 98 127 88  ?BUN 8 - 23 mg/dL 18 17.3 9  ?Creatinine 0.44 - 1.00 mg/dL 0.61 0.8 0.67  ?Sodium 135 - 145 mmol/L 139 141 134(L)  ?Potassium 3.5 - 5.1 mmol/L 3.9 3.8 4.5  ?Chloride 98 - 111 mmol/L 104 - 102  ?CO2 22 - 32 mmol/L '27 25 25  ' ?Calcium 8.9 - 10.3 mg/dL 9.1 9.5 9.4  ?Total Protein 6.4 - 8.3 g/dL - 7.7 -  ?Total Bilirubin 0.20 - 1.20 mg/dL - 0.37 -  ?Alkaline Phos 40 - 150 U/L - 55 -  ?AST 5 - 34 U/L - 18 -  ?ALT 0 - 55 U/L - 24 -  ? ? ?Lab Results  ?Component  Value Date  ? WBC 11.8 (H) 02/07/2020  ? HGB 12.2 02/07/2020  ? HCT 37.0 02/07/2020  ? MCV 88.5 02/07/2020  ? PLT 231 02/07/2020  ? NEUTROABS 5.8 11/20/2015  ? ? ?ASSESSMENT & PLAN:  ?Right selective reasons I did not really read those books there this year I read that is fine not giving because they are written all over the breast cancer of upper-inner quadrant of left female breast ([place]) ?Left lumpectomy 12/04/2015: IDC with DCIS, 0.8 cm, margins negative, 0/5 lymph nodes negative, ER 90%, PR 90%, HER-2 negative, Ki-67 3%, T1 cN0 stage IA ?Oncotype Dx 10 (7% ROR) ?Adj XRT 01/07/16 to 02/20/16 ?  ?Treatment Plan: Adjuvant antiestrogen therapy with anastrozole 1 mg daily ?7 years started January 2018 ?01/15/2017: Removal of bilateral silicone implants due to capsular rupture, bilateral capsulectomies, saline augmentation bilaterally ?  ?Anastrozole Toxicities:  ?Occasional muscle cramps: Turmeric has helped this. ?Denies any hot flashes ?  ?Osteoarthritis: Patient has seen orthopedic specialist.  She thinks she might need hip replacement surgery. ?  ?Breast cancer surveillance: ?1.  Breast exam 07/03/2121: Benign ?2.  Mammogram: 12/09/2020: at [facility]: Benign, breast density category A ?  ?She currently resides in [place]. ?We discussed with her about duration of antiestrogen therapy.  I discussed the data for 7 years of treatment versus 5 years. ?  ?RTC in 1 year ? ? ? ?No orders of the defined types were placed in this encounter. ? ?The patient has a good understanding of the overall plan. she agrees with it. she will call with any problems that may develop before the next visit here. ?Total time spent: 30 mins including face to face time and time spent for planning, charting and co-ordination of care ? ? Harriette Ohara, MD ?07/03/21 ? ? ? I, Gardiner Coins, am acting as a scribe for Dr. Lindi Adie  ?

## 2021-07-03 ENCOUNTER — Inpatient Hospital Stay: Payer: BC Managed Care – PPO | Attending: Hematology and Oncology | Admitting: Hematology and Oncology

## 2021-07-03 ENCOUNTER — Other Ambulatory Visit: Payer: Self-pay

## 2021-07-03 DIAGNOSIS — C50212 Malignant neoplasm of upper-inner quadrant of left female breast: Secondary | ICD-10-CM | POA: Insufficient documentation

## 2021-07-03 DIAGNOSIS — Z79811 Long term (current) use of aromatase inhibitors: Secondary | ICD-10-CM | POA: Insufficient documentation

## 2021-07-03 DIAGNOSIS — Z170 Estrogen receptor positive status [ER+]: Secondary | ICD-10-CM | POA: Diagnosis not present

## 2021-07-03 NOTE — Assessment & Plan Note (Addendum)
Left lumpectomy 12/04/2015: IDC with DCIS, 0.8 cm, margins negative, 0/5 lymph nodes negative, ER 90%, PR 90%, HER-2 negative, Ki-67 3%, T1 cN0 stage IA ?Oncotype Dx 10 (7% ROR) ?Adj XRT 01/07/16 to 02/20/16 ?? ?Treatment Plan: Adjuvant antiestrogen therapy with anastrozole 1 mg daily ?5 years?started 03/10/2016 ?01/15/2017: Removal of bilateral silicone implants due to capsular rupture, bilateral capsulectomies, saline augmentation bilaterally ?? ?Anastrozole Toxicities:  ?Occasional muscle cramps: Turmeric has helped this. ?Denies any hot flashes ?? ?Osteoarthritis: Patient has seen orthopedic specialist.??She thinks she might need hip replacement surgery. ?? ?Breast cancer surveillance: ?1. ?Breast exam 07/03/2121: Benign ?2. ?Mammogram: 12/09/2020: at [facility]: Benign, breast density category A ?? ?She has a property in [place] and is looking to transition to [place] for the future in the next 1 to 2 years. ?We discussed with her about duration of antiestrogen therapy. ?I discussed the data for 7 years of treatment versus 5 years. ?? ?RTC in 1 year ?

## 2022-06-20 ENCOUNTER — Other Ambulatory Visit: Payer: Self-pay | Admitting: Hematology and Oncology

## 2022-06-30 NOTE — Progress Notes (Signed)
Patient Care Team: Deland Pretty, MD as PCP - General (Internal Medicine) Stark Klein, MD as Consulting Physician (General Surgery) Nicholas Lose, MD as Consulting Physician (Hematology and Oncology) Delice Bison, Charlestine Massed, NP as Nurse Practitioner (Hematology and Oncology) Gery Pray, MD as Consulting Physician (Radiation Oncology)  DIAGNOSIS:  Encounter Diagnosis  Name Primary?   Malignant neoplasm of upper-inner quadrant of left breast in female, estrogen receptor positive ([place]) Yes    SUMMARY OF ONCOLOGIC HISTORY: Oncology History  Breast cancer of upper-inner quadrant of left female breast ([place])  11/13/2015 Initial Diagnosis   Left breast biopsy: Invasive ductal carcinoma with ADH, grade 1, ER 90%, PR 90%, Ki-67 3%, HER-2 negative ratio 1.35 copy #2; left breast spiculated mass 8 mm at 9:00 position, axilla negative, T1 BN 0 stage IA clinical stage   12/04/2015 Surgery   Left lumpectomy [facility]): IDC with DCIS, 0.8 cm, margins negative, 0/5 lymph nodes negative, ER 90%, PR 90%, HER-2 negative, Ki-67 3%, T1 cN0 stage IA   12/04/2015 Oncotype testing   10/7%, low risk   01/07/2016 - 02/20/2016 Radiation Therapy   Adjuvant XRT ([facility]): 1. The Left breast was treated to 50.4 Gy in 28 fractions at 1.8 Gy per fraction. 2. The Left breast was boosted to 10 Gy in 5 fractions at 2 Gy per fraction.    03/10/2016 -  Anti-estrogen oral therapy   Anastrozole 1 mg by mouth daily     CHIEF COMPLIANT:  Follow-up of left breast cancer on anastrozole therapy   INTERVAL HISTORY: Christina Rivera is a 67 y.o. with above-mentioned history of left breast cancer treated with lumpectomy, radiation, and who is currently on antiestrogen therapy with anastrozole. She presents to the clinic today for annual follow-up.  She is super excited about having a second [place] born anytime now.   ALLERGIES:  is allergic to tramadol, doxycycline, neomycin, and tape.  MEDICATIONS:  Current  Outpatient Medications  Medication Sig Dispense Refill   anastrozole (ARIMIDEX) 1 MG tablet Take 1 tablet (1 mg total) by mouth daily. 90 tablet 0   carboxymethylcellulose (REFRESH PLUS) 0.5 % SOLN 1 drop 2 (two) times daily as needed (dry eyes).     FINACEA 15 % cream Apply 1 application topically daily.      Triamcinolone Acetonide (NASACORT ALLERGY 24HR NA) Place 2 sprays into the nose daily.      Turmeric 500 MG CAPS Take 1 capsule by mouth daily. (Patient taking differently: Take 500 mg by mouth daily. )     No current facility-administered medications for this visit.    PHYSICAL EXAMINATION: ECOG PERFORMANCE STATUS: 1 - Symptomatic but completely ambulatory  Vitals:   07/06/22 1102  BP: (!) 154/69  Pulse: 61  Resp: 13  Temp: 97.7 F (36.5 C)  SpO2: 100%   Filed Weights   07/06/22 1102  Weight: 208 lb 12.8 oz (94.7 kg)      LABORATORY DATA:  I have reviewed the data as listed    Latest Ref Rng & Units 01/25/2020   11:34 AM 11/20/2015   12:25 PM 09/29/2006   10:47 AM  CMP  Glucose 70 - 99 mg/dL 98  127  88   BUN 8 - 23 mg/dL 18  17.3  9   Creatinine 0.44 - 1.00 mg/dL 0.61  0.8  0.67   Sodium 135 - 145 mmol/L 139  141  134   Potassium 3.5 - 5.1 mmol/L 3.9  3.8  4.5   Chloride 98 - 111  mmol/L 104   102   CO2 22 - 32 mmol/L 27  25  25    Calcium 8.9 - 10.3 mg/dL 9.1  9.5  9.4   Total Protein 6.4 - 8.3 g/dL  7.7    Total Bilirubin 0.20 - 1.20 mg/dL  0.37    Alkaline Phos 40 - 150 U/L  55    AST 5 - 34 U/L  18    ALT 0 - 55 U/L  24      Lab Results  Component Value Date   WBC 11.8 (H) 02/07/2020   HGB 12.2 02/07/2020   HCT 37.0 02/07/2020   MCV 88.5 02/07/2020   PLT 231 02/07/2020   NEUTROABS 5.8 11/20/2015    ASSESSMENT & PLAN:  Breast cancer of upper-inner quadrant of left female breast ([place]) Left lumpectomy 12/04/2015: IDC with DCIS, 0.8 cm, margins negative, 0/5 lymph nodes negative, ER 90%, PR 90%, HER-2 negative, Ki-67 3%, T1 cN0 stage IA Oncotype Dx  10 (7% ROR) Adj XRT 01/07/16 to 02/20/16   Treatment Plan: Adjuvant antiestrogen therapy with anastrozole 1 mg daily 5 years started 03/10/2016 01/15/2017: Removal of bilateral silicone implants due to capsular rupture, bilateral capsulectomies, saline augmentation bilaterally   Anastrozole Toxicities:  Occasional muscle cramps: Turmeric has helped this. Denies any hot flashes She will complete 7 years of therapy by December 2024 and then she can discontinue it.   Breast cancer surveillance: M[facility]: at [facility]: Benign   She has moved to [facility] but had kept up her appointments with [place] until she finished with antiestrogen therapy.   RTC on an as-needed basis.    No orders of the defined types were placed in this encounter.  The patient has a good understanding of the overall plan. she agrees with it. she will call with any problems that may develop before the next visit here. Total time spent: 30 mins including face to face time and time spent for planning, charting and co-ordination of care   Harriette Ohara, MD 07/06/22    I Gardiner Coins am acting as a Education administrator for [employer]  I have reviewed the above documentation for accuracy and completeness, and I agree with the above.

## 2022-07-05 NOTE — Assessment & Plan Note (Signed)
Left lumpectomy 12/04/2015: IDC with DCIS, 0.8 cm, margins negative, 0/5 lymph nodes negative, ER 90%, PR 90%, HER-2 negative, Ki-67 3%, T1 cN0 stage IA Oncotype Dx 10 (7% ROR) Adj XRT 01/07/16 to 02/20/16   Treatment Plan: Adjuvant antiestrogen therapy with anastrozole 1 mg daily 5 years started 03/10/2016 01/15/2017: Removal of bilateral silicone implants due to capsular rupture, bilateral capsulectomies, saline augmentation bilaterally   Anastrozole Toxicities:  Occasional muscle cramps: Turmeric has helped this. Denies any hot flashes She will complete 7 years of therapy by December 2024 and then she can discontinue it.   Breast cancer surveillance: Mammogram: at [facility]: Benign   She has moved to [facility] but had kept up her appointments with [place] until she finished with antiestrogen therapy.   RTC on an as-needed basis.

## 2022-07-06 ENCOUNTER — Inpatient Hospital Stay: Payer: BC Managed Care – PPO | Attending: Hematology and Oncology | Admitting: Hematology and Oncology

## 2022-07-06 VITALS — BP 154/69 | HR 61 | Temp 97.7°F | Resp 13 | Wt 208.8 lb

## 2022-07-06 DIAGNOSIS — C50212 Malignant neoplasm of upper-inner quadrant of left female breast: Secondary | ICD-10-CM | POA: Insufficient documentation

## 2022-07-06 DIAGNOSIS — Z170 Estrogen receptor positive status [ER+]: Secondary | ICD-10-CM | POA: Diagnosis not present

## 2022-07-06 DIAGNOSIS — Z79811 Long term (current) use of aromatase inhibitors: Secondary | ICD-10-CM | POA: Diagnosis not present

## 2022-07-06 MED ORDER — ANASTROZOLE 1 MG PO TABS: 1.0000 mg | ORAL_TABLET | Freq: Every day | ORAL | 0 refills | Status: AC
